# Patient Record
Sex: Male | Born: 1945 | Race: White | Hispanic: No | Marital: Married | Smoking: Never smoker
Health system: Southern US, Community
[De-identification: ages and names within clinical notes are randomized; demographics above are authoritative.]

## PROBLEM LIST (undated history)

## (undated) DIAGNOSIS — N486 Induration penis plastica: Secondary | ICD-10-CM

## (undated) DIAGNOSIS — E039 Hypothyroidism, unspecified: Secondary | ICD-10-CM

## (undated) HISTORY — PX: BIOPSY PROSTATE: PRO28

## (undated) HISTORY — PX: TONSILLECTOMY: SUR1361

## (undated) HISTORY — PX: EXCISIONAL HEMORRHOIDECTOMY: SHX1541

---

## 2004-12-01 HISTORY — PX: OTHER SURGICAL HISTORY: SHX169

## 2004-12-17 ENCOUNTER — Ambulatory Visit (HOSPITAL_BASED_OUTPATIENT_CLINIC_OR_DEPARTMENT_OTHER): Admission: RE | Admit: 2004-12-17 | Discharge: 2004-12-17 | Payer: Self-pay | Admitting: Urology

## 2008-05-09 ENCOUNTER — Encounter: Admission: RE | Admit: 2008-05-09 | Discharge: 2008-05-09 | Payer: Self-pay | Admitting: Otolaryngology

## 2008-05-23 ENCOUNTER — Encounter (INDEPENDENT_AMBULATORY_CARE_PROVIDER_SITE_OTHER): Payer: Self-pay | Admitting: Interventional Radiology

## 2008-05-23 ENCOUNTER — Other Ambulatory Visit: Admission: RE | Admit: 2008-05-23 | Discharge: 2008-05-23 | Payer: Self-pay | Admitting: Interventional Radiology

## 2008-05-23 ENCOUNTER — Encounter: Admission: RE | Admit: 2008-05-23 | Discharge: 2008-05-23 | Payer: Self-pay | Admitting: Otolaryngology

## 2008-12-01 HISTORY — PX: OTHER SURGICAL HISTORY: SHX169

## 2009-04-04 IMAGING — US US BIOPSY
1 series · 11 of 11 positions shown · non-contrast
Comparison: Comparison is made with ultrasound performed 05/09/2008
which revealed a dominant solid-appearing nodule in the right upper
pole of the thyroid. Request has been made for fine needle aspirate
of this nodule.
COMPARISON: Comparison is made with ultrasound performed
05/09/2008 which revealed a complex cyst in the lower pole on the
left.  Request has been made for fine needle aspirate.

CLINICAL DATA: Palpable bilateral thyroid nodules.  Request has
been made for fine needle aspirate.

ULTRASOUND-GUIDED NEEDLE ASPIRATE BIOPSY, RIGHT LOBE OF THYROID
The above procedure was discussed with the patient and written
informed consent was obtained.

[Series 1: us biopsy · 11 acquisitions, 11 frames shown]
[im 1/11]
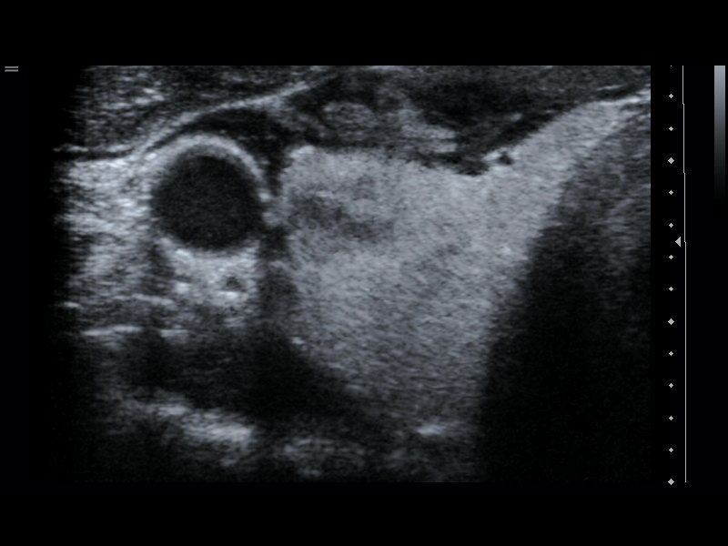
[im 2/11]
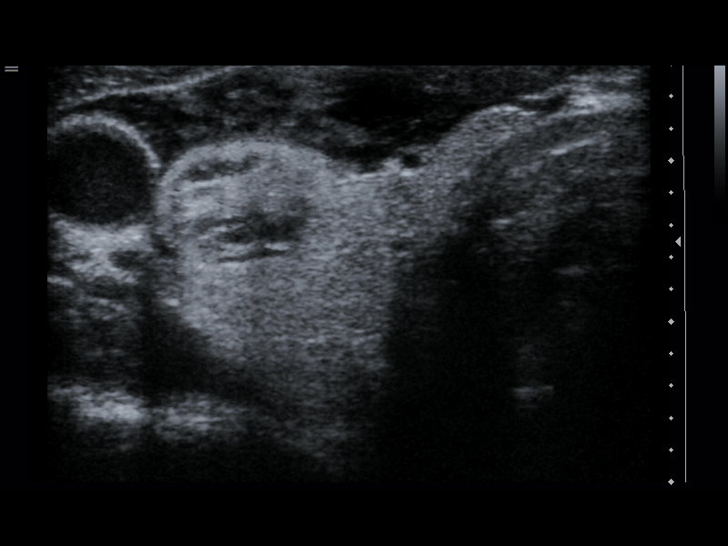
[im 3/11]
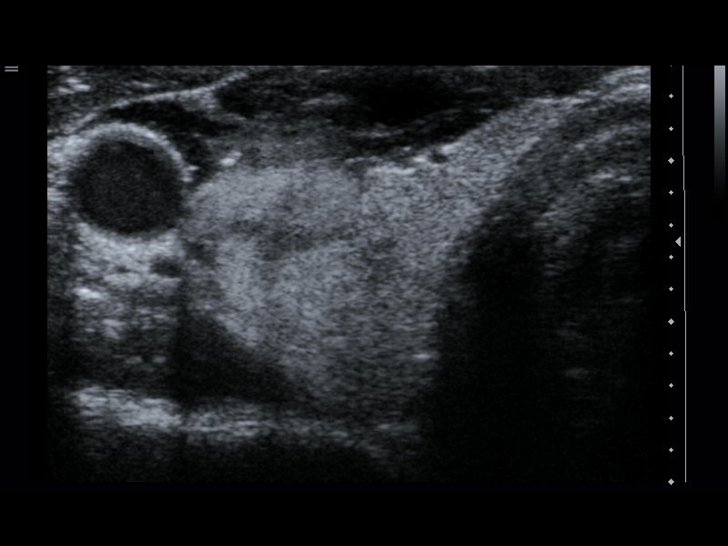
[im 4/11]
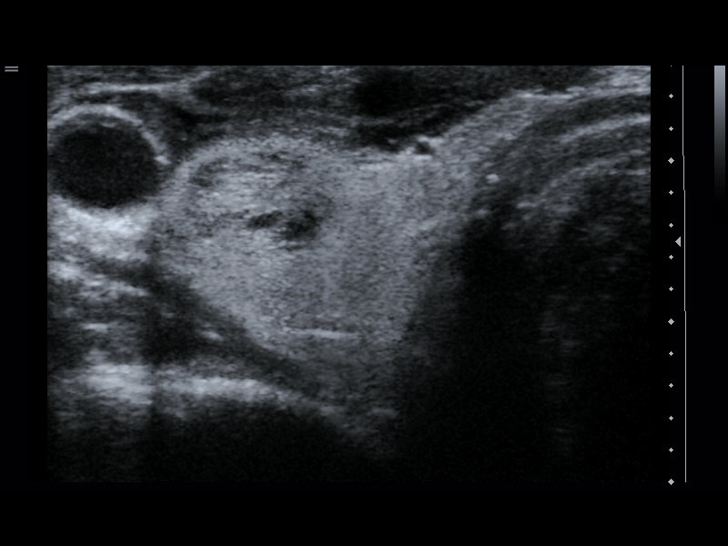
[im 5/11]
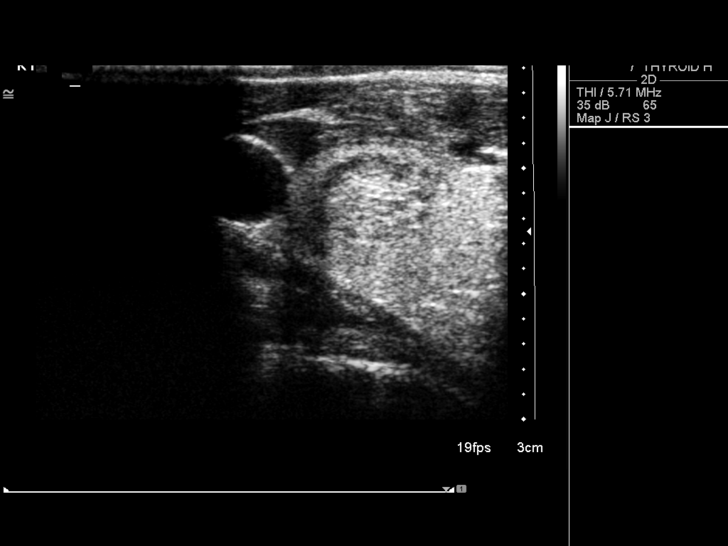
[im 6/11]
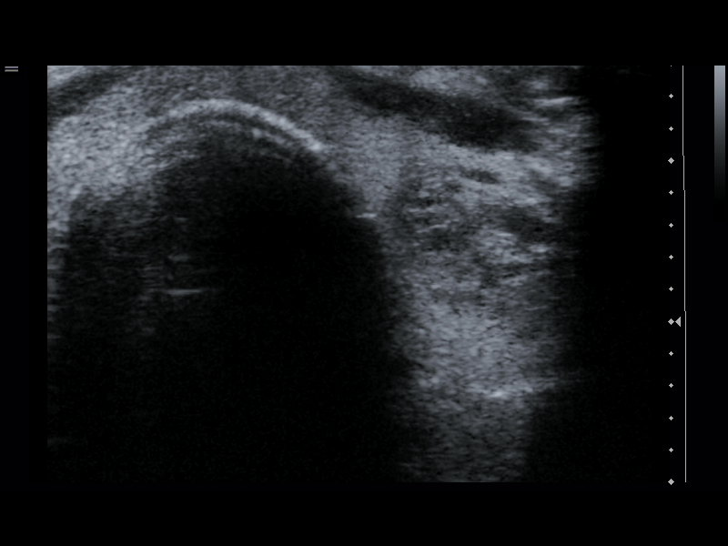
[im 7/11]
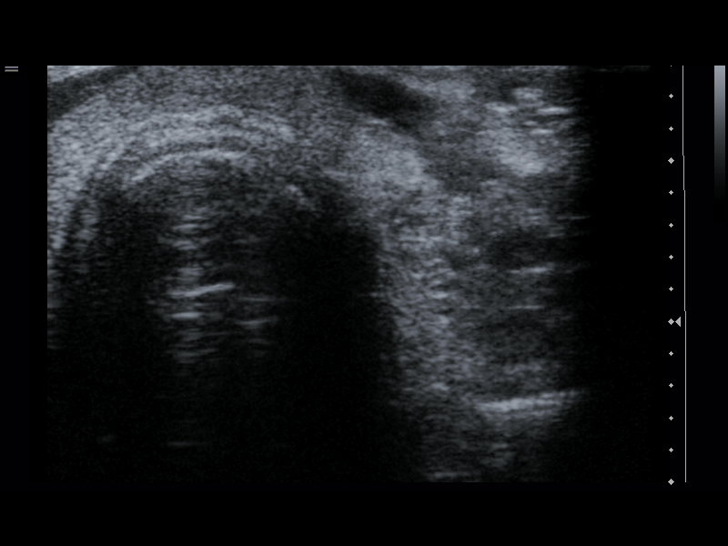
[im 8/11]
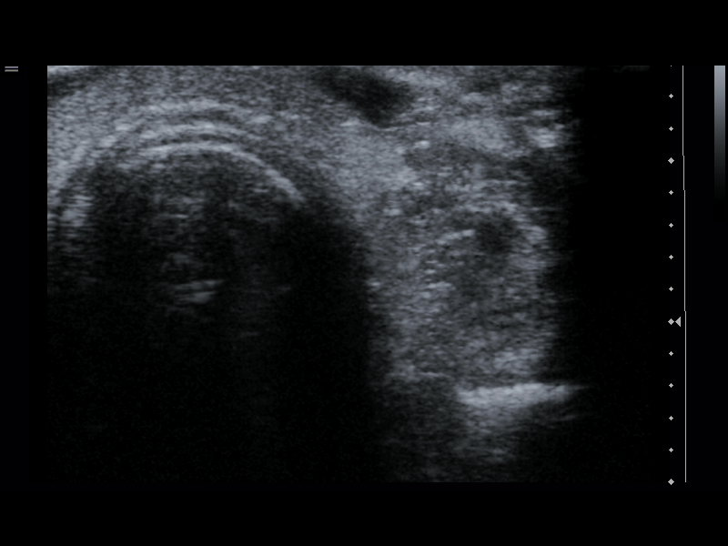
[im 9/11]
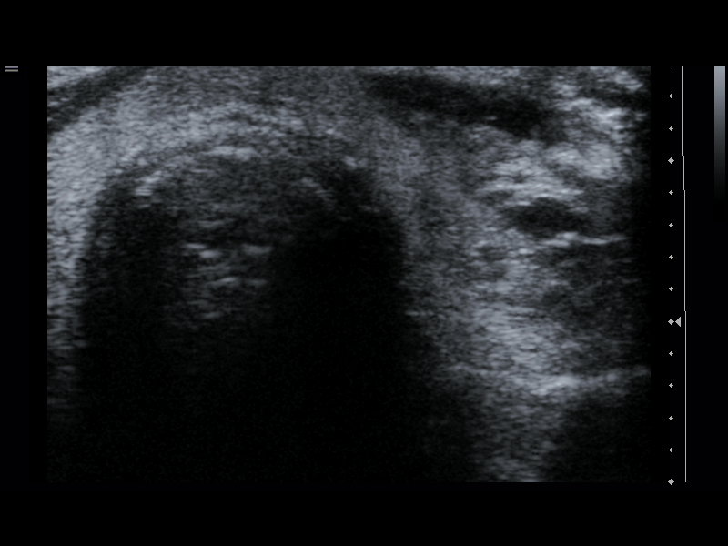
[im 10/11]
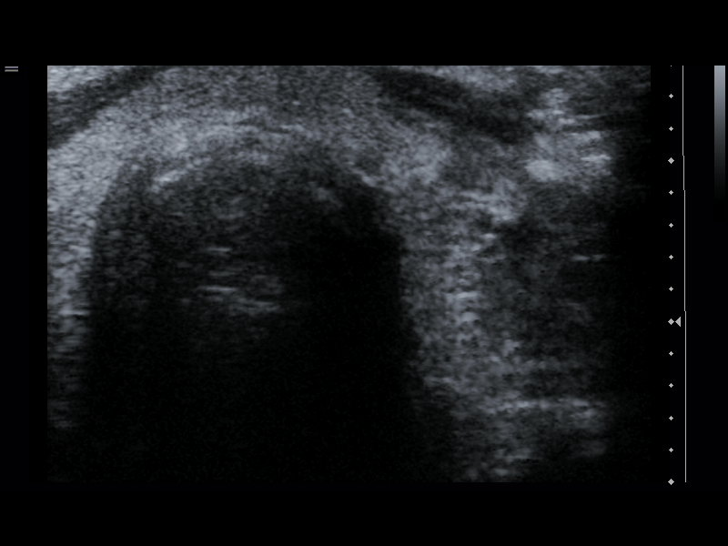
[im 11/11]
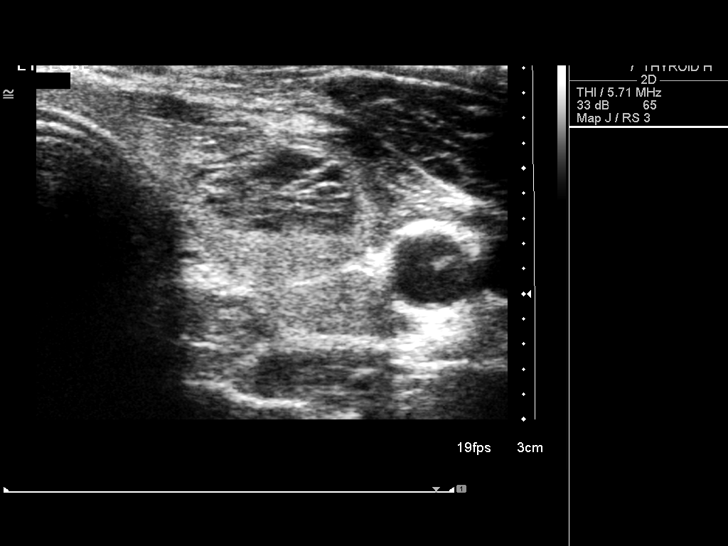

[11 of 11 positions shown; findings below may reference images not displayed]

FINDINGS: Ultrasound was performed to localize and mark an adequate
site for the biopsy.  The patient was then prepped and draped in a
normal sterile fashion.  Local anesthesia was provided with 1%
lidocaine.  Using direct ultrasound guidance, three passes were
made using 25 gauge needles into the nodule within the right lobe
of the thyroid.  Ultrasound was used to confirm needle placements
on all three occasions.  Specimens were sent to Pathology for
analysis.  Post procedural imaging demonstrated no hematoma or
immediate complication.  The patient tolerated the procedure well.
IMPRESSION: [Successful fine needle aspirate of dominant solid nodule of the
right upper pole of the thyroid as described above.

ULTRASOUND-GUIDED NEEDLE ASPIRATE BIOPSY, LEFT LOBE OF THYROID

The above procedure was discussed with the patient and written
informed consent was obtained.
FINDINGS: Ultrasound was performed to localize and mark an adequate
site for the biopsy.  The patient was then prepped and draped in a
normal sterile fashion.  Local anesthesia was provided with 1%
lidocaine.  Using direct ultrasound guidance, three passes were
made using 25 gauge needles into the nodule within the left lobe of
the thyroid.  Ultrasound was used to confirm needle placements on
all three occasions.  Specimens were sent to Pathology for
analysis.  Post procedural imaging demonstrated no hematoma or
immediate complication.  The patient tolerated the procedure well.
IMPRESSION: Successful fine needle aspirate of complex cyst of  the left lower
pole of the thyroid as described above.

Read by: Tollner, Labanc.-JULIO JUAN

## 2009-06-19 ENCOUNTER — Encounter: Admission: RE | Admit: 2009-06-19 | Discharge: 2009-06-19 | Payer: Self-pay | Admitting: Otolaryngology

## 2011-04-18 NOTE — Op Note (Signed)
NAME:  CAVON, NICOLLS NO.:  [id number]   MEDICAL RECORD NO.:  [id number]          PATIENT TYPE:  AMB   LOCATION:  [facility]                         FACILITY:  [facility]   PHYSICIAN:  Jamison Neighbor, M.D.  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   POSTOPERATIVE DIAGNOSIS:  Hydrocele of the cord.   PROCEDURE:  Right hydrocelectomy.   SURGEON:  Jamison Neighbor, M.D.   ANESTHESIA:  General.   COMPLICATIONS:  None.   DRAINS:  Two Penrose drains in the right hemi-scrotum.   INDICATIONS FOR PROCEDURE:  This 65 year old male has a fluid-filled mass in  the right testicle with possible inclusion of a spermatocele.  This appears  to be primarily in the cord, and not as involved with the testicle proper.  The testicle itself appears to be normal.  The patient would like to have  this excised.  He understands the risks and benefits of the procedure and  gave a full informed consent.   DESCRIPTION OF PROCEDURE:  After the successful induction of general  anesthesia, the patient was placed in the supine position and prepped with  Betadine and draped in the usual sterile fashion.  An incision was made in  the right hemi-scrotum, and the dissection was carried down until the  hydrocele sac was identified.  The sac was dissected away from the  surrounding tissue.  The gubernaculum was taken down, and the testicle  itself was elevated.  The hydrocele sac was then opened.  It appeared to  surround the testicle somewhat but primarily involved the cord itself.  This  entire area was drained.  The sac was turned inside out.  The edges were  oversewn with chromic.  Hemostasis was obtained with electrocautery.  The  epididymis was carefully checked, and it was free of any lesions.  There was  no true spermatocele.  The  testicle itself was normal in all ways.  The  testicle was returned back into the right hemi-scrotum.  The subcutaneous  tissue and dartos layer were pulled together with a running suture of #3-0  Vicryl.  The skin was closed with a running suture of #3-0 chromic.   The patient tolerated the procedure well.  He was sent home with a Penrose  drain coming out of a small puncture hole that was sutured in place with  chromic.  He was also given a scrotal support and fluffs.   DISPOSITION:  He will be sent home with doxycycline and pain medication.  He  was also given instructions about the use of ice and a scrotal support.      RJE/MEDQ  D:  12/17/2004  T:  12/17/2004  Job:  [id number]   cc:   Weyman Pedro, M.D.  [place], [place]

## 2015-07-30 ENCOUNTER — Other Ambulatory Visit: Payer: Self-pay | Admitting: Endocrinology

## 2015-07-30 DIAGNOSIS — E049 Nontoxic goiter, unspecified: Secondary | ICD-10-CM

## 2016-01-16 ENCOUNTER — Ambulatory Visit
Admission: RE | Admit: 2016-01-16 | Discharge: 2016-01-16 | Disposition: A | Discharge status: Home / Self Care | Payer: Medicare Other | Source: Ambulatory Visit | Attending: Endocrinology | Admitting: Endocrinology

## 2016-01-16 ENCOUNTER — Encounter (INDEPENDENT_AMBULATORY_CARE_PROVIDER_SITE_OTHER): Payer: Self-pay

## 2016-01-16 DIAGNOSIS — E049 Nontoxic goiter, unspecified: Secondary | ICD-10-CM

## 2016-01-16 DIAGNOSIS — E02 Subclinical iodine-deficiency hypothyroidism: Secondary | ICD-10-CM | POA: Diagnosis not present

## 2016-01-16 DIAGNOSIS — E041 Nontoxic single thyroid nodule: Secondary | ICD-10-CM | POA: Diagnosis not present

## 2016-01-25 DIAGNOSIS — E02 Subclinical iodine-deficiency hypothyroidism: Secondary | ICD-10-CM | POA: Diagnosis not present

## 2016-01-25 DIAGNOSIS — E049 Nontoxic goiter, unspecified: Secondary | ICD-10-CM | POA: Diagnosis not present

## 2016-01-28 ENCOUNTER — Other Ambulatory Visit: Payer: Self-pay | Admitting: Endocrinology

## 2016-01-28 DIAGNOSIS — E049 Nontoxic goiter, unspecified: Secondary | ICD-10-CM

## 2016-02-28 DIAGNOSIS — R972 Elevated prostate specific antigen [PSA]: Secondary | ICD-10-CM | POA: Diagnosis not present

## 2016-02-28 DIAGNOSIS — N138 Other obstructive and reflux uropathy: Secondary | ICD-10-CM | POA: Diagnosis not present

## 2016-02-28 DIAGNOSIS — N486 Induration penis plastica: Secondary | ICD-10-CM | POA: Diagnosis not present

## 2016-02-28 DIAGNOSIS — N400 Enlarged prostate without lower urinary tract symptoms: Secondary | ICD-10-CM | POA: Diagnosis not present

## 2016-03-11 DIAGNOSIS — E02 Subclinical iodine-deficiency hypothyroidism: Secondary | ICD-10-CM | POA: Diagnosis not present

## 2016-04-01 ENCOUNTER — Ambulatory Visit
Admission: RE | Admit: 2016-04-01 | Discharge: 2016-04-01 | Disposition: A | Discharge status: Home / Self Care | Payer: Medicare Other | Source: Ambulatory Visit | Attending: Endocrinology | Admitting: Endocrinology

## 2016-04-01 DIAGNOSIS — E049 Nontoxic goiter, unspecified: Secondary | ICD-10-CM

## 2016-04-01 DIAGNOSIS — E042 Nontoxic multinodular goiter: Secondary | ICD-10-CM | POA: Diagnosis not present

## 2016-04-17 DIAGNOSIS — R5383 Other fatigue: Secondary | ICD-10-CM | POA: Diagnosis not present

## 2016-04-17 DIAGNOSIS — Z299 Encounter for prophylactic measures, unspecified: Secondary | ICD-10-CM | POA: Diagnosis not present

## 2016-04-17 DIAGNOSIS — E7800 Pure hypercholesterolemia, unspecified: Secondary | ICD-10-CM | POA: Diagnosis not present

## 2016-04-17 DIAGNOSIS — Z7189 Other specified counseling: Secondary | ICD-10-CM | POA: Diagnosis not present

## 2016-04-17 DIAGNOSIS — Z125 Encounter for screening for malignant neoplasm of prostate: Secondary | ICD-10-CM | POA: Diagnosis not present

## 2016-04-17 DIAGNOSIS — Z1389 Encounter for screening for other disorder: Secondary | ICD-10-CM | POA: Diagnosis not present

## 2016-04-17 DIAGNOSIS — Z0000 Encounter for general adult medical examination without abnormal findings: Secondary | ICD-10-CM | POA: Diagnosis not present

## 2016-04-17 DIAGNOSIS — Z6823 Body mass index (BMI) 23.0-23.9, adult: Secondary | ICD-10-CM | POA: Diagnosis not present

## 2016-04-17 DIAGNOSIS — Z1211 Encounter for screening for malignant neoplasm of colon: Secondary | ICD-10-CM | POA: Diagnosis not present

## 2016-04-17 DIAGNOSIS — Z79899 Other long term (current) drug therapy: Secondary | ICD-10-CM | POA: Diagnosis not present

## 2016-04-18 DIAGNOSIS — E7800 Pure hypercholesterolemia, unspecified: Secondary | ICD-10-CM | POA: Diagnosis not present

## 2016-04-18 DIAGNOSIS — Z79899 Other long term (current) drug therapy: Secondary | ICD-10-CM | POA: Diagnosis not present

## 2016-04-18 DIAGNOSIS — R5383 Other fatigue: Secondary | ICD-10-CM | POA: Diagnosis not present

## 2016-04-18 DIAGNOSIS — Z125 Encounter for screening for malignant neoplasm of prostate: Secondary | ICD-10-CM | POA: Diagnosis not present

## 2016-04-24 DIAGNOSIS — E02 Subclinical iodine-deficiency hypothyroidism: Secondary | ICD-10-CM | POA: Diagnosis not present

## 2016-04-30 DIAGNOSIS — E02 Subclinical iodine-deficiency hypothyroidism: Secondary | ICD-10-CM | POA: Diagnosis not present

## 2016-04-30 DIAGNOSIS — E049 Nontoxic goiter, unspecified: Secondary | ICD-10-CM | POA: Diagnosis not present

## 2016-05-20 DIAGNOSIS — H2513 Age-related nuclear cataract, bilateral: Secondary | ICD-10-CM | POA: Diagnosis not present

## 2016-05-22 DIAGNOSIS — L821 Other seborrheic keratosis: Secondary | ICD-10-CM | POA: Diagnosis not present

## 2016-05-22 DIAGNOSIS — D239 Other benign neoplasm of skin, unspecified: Secondary | ICD-10-CM | POA: Diagnosis not present

## 2016-05-22 DIAGNOSIS — L570 Actinic keratosis: Secondary | ICD-10-CM | POA: Diagnosis not present

## 2016-07-07 DIAGNOSIS — H40013 Open angle with borderline findings, low risk, bilateral: Secondary | ICD-10-CM | POA: Diagnosis not present

## 2016-07-07 DIAGNOSIS — H2513 Age-related nuclear cataract, bilateral: Secondary | ICD-10-CM | POA: Diagnosis not present

## 2016-07-07 DIAGNOSIS — H5203 Hypermetropia, bilateral: Secondary | ICD-10-CM | POA: Diagnosis not present

## 2016-07-07 DIAGNOSIS — H524 Presbyopia: Secondary | ICD-10-CM | POA: Diagnosis not present

## 2016-07-11 DIAGNOSIS — H2511 Age-related nuclear cataract, right eye: Secondary | ICD-10-CM | POA: Diagnosis not present

## 2016-07-18 NOTE — Patient Instructions (Addendum)
Your procedure is scheduled on: 07/24/2016  Report to [facility] at  66  AM.  Call this number if you have problems the morning of surgery: [phone]   Do not eat food or drink liquids :After Midnight.      Take these medicines the morning of surgery with A SIP OF WATER: Synthroid   Do not wear jewelry, make-up or nail polish.  Do not wear lotions, powders, or perfumes. You may wear deodorant.  Do not shave 48 hours prior to surgery.  Do not bring valuables to the hospital.  Contacts, dentures or bridgework may not be worn into surgery.  Leave suitcase in the car. After surgery it may be brought to your room.  For patients admitted to the hospital, checkout time is 11:00 AM the day of discharge.   Patients discharged the day of surgery will not be allowed to drive home.  :     Please read over the following fact sheets that you were given: Coughing and Deep Breathing, Surgical Site Infection Prevention, Anesthesia Post-op Instructions and Care and Recovery After Surgery    Cataract A cataract is a clouding of the lens of the eye. When a lens becomes cloudy, vision is reduced based on the degree and nature of the clouding. Many cataracts reduce vision to some degree. Some cataracts make people more near-sighted as they develop. Other cataracts increase glare. Cataracts that are ignored and become worse can sometimes look white. The white color can be seen through the pupil. CAUSES   Aging. However, cataracts may occur at any age, even in newborns.   Certain drugs.   Trauma to the eye.   Certain diseases such as diabetes.   Specific eye diseases such as chronic inflammation inside the eye or a sudden attack of a rare form of glaucoma.   Inherited or acquired medical problems.  SYMPTOMS   Gradual, progressive drop in vision in the affected eye.   Severe, rapid visual loss. This most often happens when trauma is the cause.  DIAGNOSIS  To detect a cataract, an eye doctor  examines the lens. Cataracts are best diagnosed with an exam of the eyes with the pupils enlarged (dilated) by drops.  TREATMENT  For an early cataract, vision may improve by using different eyeglasses or stronger lighting. If that does not help your vision, surgery is the only effective treatment. A cataract needs to be surgically removed when vision loss interferes with your everyday activities, such as driving, reading, or watching TV. A cataract may also have to be removed if it prevents examination or treatment of another eye problem. Surgery removes the cloudy lens and usually replaces it with a substitute lens (intraocular lens, IOL).  At a time when both you and your doctor agree, the cataract will be surgically removed. If you have cataracts in both eyes, only one is usually removed at a time. This allows the operated eye to heal and be out of danger from any possible problems after surgery (such as infection or poor wound healing). In rare cases, a cataract may be doing damage to your eye. In these cases, your caregiver may advise surgical removal right away. The vast majority of people who have cataract surgery have better vision afterward. HOME CARE INSTRUCTIONS  If you are not planning surgery, you may be asked to do the following:  Use different eyeglasses.   Use stronger or brighter lighting.   Ask your eye doctor about reducing your medicine dose or  changing medicines if it is thought that a medicine caused your cataract. Changing medicines does not make the cataract go away on its own.   Become familiar with your surroundings. Poor vision can lead to injury. Avoid bumping into things on the affected side. You are at a higher risk for tripping or falling.   Exercise extreme care when driving or operating machinery.   Wear sunglasses if you are sensitive to bright light or experiencing problems with glare.  SEEK IMMEDIATE MEDICAL CARE IF:   You have a worsening or sudden vision  loss.   You notice redness, swelling, or increasing pain in the eye.   You have a fever.  Document Released: 11/17/2005 Document Revised: 11/06/2011 Document Reviewed: 07/11/2011 [facility] Patient Information 2012 [place].PATIENT INSTRUCTIONS POST-ANESTHESIA  IMMEDIATELY FOLLOWING SURGERY:  Do not drive or operate machinery for the first twenty four hours after surgery.  Do not make any important decisions for twenty four hours after surgery or while taking narcotic pain medications or sedatives.  If you develop intractable nausea and vomiting or a severe headache please notify your doctor immediately.  FOLLOW-UP:  Please make an appointment with your surgeon as instructed. You do not need to follow up with anesthesia unless specifically instructed to do so.  WOUND CARE INSTRUCTIONS (if applicable):  Keep a dry clean dressing on the anesthesia/puncture wound site if there is drainage.  Once the wound has quit draining you may leave it open to air.  Generally you should leave the bandage intact for twenty four hours unless there is drainage.  If the epidural site drains for more than 36-48 hours please call the anesthesia department.  QUESTIONS?:  Please feel free to call your physician or the hospital operator if you have any questions, and they will be happy to assist you.

## 2016-07-21 ENCOUNTER — Encounter (HOSPITAL_COMMUNITY): Payer: Self-pay

## 2016-07-21 ENCOUNTER — Other Ambulatory Visit: Payer: Self-pay

## 2016-07-21 ENCOUNTER — Encounter (HOSPITAL_COMMUNITY)
Admission: RE | Admit: 2016-07-21 | Discharge: 2016-07-21 | Disposition: A | Discharge status: Home / Self Care | Payer: Medicare Other | Source: Ambulatory Visit | Attending: Ophthalmology | Admitting: Ophthalmology

## 2016-07-21 DIAGNOSIS — E039 Hypothyroidism, unspecified: Secondary | ICD-10-CM | POA: Diagnosis not present

## 2016-07-21 DIAGNOSIS — H2511 Age-related nuclear cataract, right eye: Secondary | ICD-10-CM | POA: Diagnosis not present

## 2016-07-21 HISTORY — DX: Induration penis plastica: N48.6

## 2016-07-21 HISTORY — DX: Hypothyroidism, unspecified: E03.9

## 2016-07-21 LAB — CBC WITH DIFFERENTIAL/PLATELET
Basophils Absolute: 0.1 10*3/uL (ref 0.0–0.1)
Basophils Relative: 1 %
EOS PCT: 3 %
Eosinophils Absolute: 0.2 10*3/uL (ref 0.0–0.7)
HEMATOCRIT: 42.9 % (ref 39.0–52.0)
Hemoglobin: 14.3 g/dL (ref 13.0–17.0)
LYMPHS PCT: 24 %
Lymphs Abs: 1.4 10*3/uL (ref 0.7–4.0)
MCH: 29.5 pg (ref 26.0–34.0)
MCHC: 33.3 g/dL (ref 30.0–36.0)
MCV: 88.6 fL (ref 78.0–100.0)
MONO ABS: 0.5 10*3/uL (ref 0.1–1.0)
MONOS PCT: 8 %
NEUTROS ABS: 3.8 10*3/uL (ref 1.7–7.7)
Neutrophils Relative %: 64 %
PLATELETS: 167 10*3/uL (ref 150–400)
RBC: 4.84 MIL/uL (ref 4.22–5.81)
RDW: 13 % (ref 11.5–15.5)
WBC: 5.9 10*3/uL (ref 4.0–10.5)

## 2016-07-21 LAB — BASIC METABOLIC PANEL
Anion gap: 3 — ABNORMAL LOW (ref 5–15)
BUN: 13 mg/dL (ref 6–20)
CALCIUM: 9 mg/dL (ref 8.9–10.3)
CO2: 29 mmol/L (ref 22–32)
Chloride: 105 mmol/L (ref 101–111)
Creatinine, Ser: 0.91 mg/dL (ref 0.61–1.24)
GFR calc Af Amer: >60 mL/min (ref >60)
GFR calc non Af Amer: >60 mL/min (ref >60)
Glucose, Bld: 99 mg/dL (ref 65–99)
Potassium: 4.1 mmol/L (ref 3.5–5.1)
Sodium: 137 mmol/L (ref 135–145)

## 2016-07-23 ENCOUNTER — Encounter (HOSPITAL_COMMUNITY): Payer: Self-pay | Admitting: Anesthesiology

## 2016-07-23 NOTE — Anesthesia Preprocedure Evaluation (Addendum)
Anesthesia Evaluation  Patient identified by MRN, date of birth, ID band Patient awake    Reviewed: Allergy & Precautions, NPO status , Patient's Chart, lab work & pertinent test results  Airway Mallampati: I  TM Distance: >3 FB Neck ROM: Full    Dental  (+) Caps,    Pulmonary neg pulmonary ROS,    Pulmonary exam normal breath sounds clear to auscultation       Cardiovascular negative cardio ROS Normal cardiovascular exam Rhythm:Regular Rate:Normal     Neuro/Psych Cataract OD negative psych ROS   GI/Hepatic negative GI ROS, Neg liver ROS,   Endo/Other  Hypothyroidism   Renal/GU negative Renal ROS   Peyronie's Disease    Musculoskeletal negative musculoskeletal ROS (+)   Abdominal   Peds  Hematology negative hematology ROS (+)   Anesthesia Other Findings   Reproductive/Obstetrics                             Chemistry      Component Value Date/Time   NA 137 07/21/2016 1100   K 4.1 07/21/2016 1100   CL 105 07/21/2016 1100   CO2 29 07/21/2016 1100   BUN 13 07/21/2016 1100   CREATININE 0.91 07/21/2016 1100      Component Value Date/Time   CALCIUM 9.0 07/21/2016 1100     Lab Results  Component Value Date   WBC 5.9 07/21/2016   HGB 14.3 07/21/2016   HCT 42.9 07/21/2016   MCV 88.6 07/21/2016   PLT 167 07/21/2016   EKG: sinus bradycardia.  Anesthesia Physical Anesthesia Plan  ASA: II  Anesthesia Plan: MAC   Post-op Pain Management:    Induction:   Airway Management Planned: Natural Airway and Nasal Cannula  Additional Equipment:   Intra-op Plan:   Post-operative Plan:   Informed Consent: I have reviewed the patients History and Physical, chart, labs and discussed the procedure including the risks, benefits and alternatives for the proposed anesthesia with the patient or authorized representative who has indicated his/her understanding and acceptance.     Plan  Discussed with: CRNA, Anesthesiologist and Surgeon  Anesthesia Plan Comments:         Anesthesia Quick Evaluation

## 2016-07-24 ENCOUNTER — Encounter (HOSPITAL_COMMUNITY): Payer: Self-pay

## 2016-07-24 ENCOUNTER — Ambulatory Visit (HOSPITAL_COMMUNITY)
Admission: RE | Admit: 2016-07-24 | Discharge: 2016-07-24 | Disposition: A | Discharge status: Home / Self Care | Payer: Medicare Other | Source: Ambulatory Visit | Attending: Ophthalmology | Admitting: Ophthalmology

## 2016-07-24 ENCOUNTER — Ambulatory Visit (HOSPITAL_COMMUNITY): Payer: Medicare Other | Admitting: Anesthesiology

## 2016-07-24 ENCOUNTER — Encounter (HOSPITAL_COMMUNITY)
Admission: RE | Disposition: A | Discharge status: Home / Self Care | Payer: Self-pay | Source: Ambulatory Visit | Attending: Ophthalmology

## 2016-07-24 DIAGNOSIS — E039 Hypothyroidism, unspecified: Secondary | ICD-10-CM | POA: Diagnosis not present

## 2016-07-24 DIAGNOSIS — H2511 Age-related nuclear cataract, right eye: Secondary | ICD-10-CM | POA: Insufficient documentation

## 2016-07-24 HISTORY — PX: CATARACT EXTRACTION W/PHACO: SHX586

## 2016-07-24 SURGERY — PHACOEMULSIFICATION, CATARACT, WITH IOL INSERTION
Anesthesia: Monitor Anesthesia Care | Site: Eye | Laterality: Right

## 2016-07-24 MED ORDER — LACTATED RINGERS IV SOLN
INTRAVENOUS | Status: DC
  Administered 2016-07-24: 1000 mL via INTRAVENOUS

## 2016-07-24 MED ORDER — FENTANYL CITRATE (PF) 100 MCG/2ML IJ SOLN
25.0000 ug | INTRAMUSCULAR | Status: AC
  Administered 2016-07-24 (×2): 25 ug via INTRAVENOUS
  Filled 2016-07-24: qty 2

## 2016-07-24 MED ORDER — PROVISC 10 MG/ML IO SOLN
INTRAOCULAR | Status: DC | PRN
  Administered 2016-07-24: 0.85 mL via INTRAOCULAR

## 2016-07-24 MED ORDER — MIDAZOLAM HCL 2 MG/2ML IJ SOLN
INTRAMUSCULAR | Status: DC | PRN
  Administered 2016-07-24: 2 mg via INTRAVENOUS

## 2016-07-24 MED ORDER — LIDOCAINE 3.5 % OP GEL OPTIME - NO CHARGE
OPHTHALMIC | Status: DC | PRN
  Administered 2016-07-24: 1 [drp] via OPHTHALMIC

## 2016-07-24 MED ORDER — EPINEPHRINE HCL 1 MG/ML IJ SOLN
INTRAOCULAR | Status: DC | PRN
  Administered 2016-07-24: 500 mL

## 2016-07-24 MED ORDER — TETRACAINE HCL 0.5 % OP SOLN
1.0000 [drp] | OPHTHALMIC | Status: AC
  Administered 2016-07-24 (×3): 1 [drp] via OPHTHALMIC

## 2016-07-24 MED ORDER — MIDAZOLAM HCL 2 MG/2ML IJ SOLN
1.0000 mg | INTRAMUSCULAR | Status: DC | PRN
  Administered 2016-07-24: 2 mg via INTRAVENOUS
  Filled 2016-07-24: qty 2

## 2016-07-24 MED ORDER — BSS IO SOLN
INTRAOCULAR | Status: DC | PRN
  Administered 2016-07-24: 15 mL via INTRAOCULAR

## 2016-07-24 MED ORDER — ONDANSETRON HCL 4 MG/2ML IJ SOLN
INTRAMUSCULAR | Status: AC
  Filled 2016-07-24: qty 2

## 2016-07-24 MED ORDER — NEOMYCIN-POLYMYXIN-DEXAMETH 3.5-10000-0.1 OP SUSP
OPHTHALMIC | Status: DC | PRN
  Administered 2016-07-24: 2 [drp] via OPHTHALMIC

## 2016-07-24 MED ORDER — MIDAZOLAM HCL 2 MG/2ML IJ SOLN
INTRAMUSCULAR | Status: AC
  Filled 2016-07-24: qty 2

## 2016-07-24 MED ORDER — EPINEPHRINE HCL 1 MG/ML IJ SOLN
INTRAMUSCULAR | Status: AC
  Filled 2016-07-24: qty 1

## 2016-07-24 MED ORDER — ONDANSETRON HCL 4 MG/2ML IJ SOLN
INTRAMUSCULAR | Status: DC | PRN
  Administered 2016-07-24: 4 mg via INTRAVENOUS

## 2016-07-24 MED ORDER — PHENYLEPHRINE HCL 2.5 % OP SOLN
1.0000 [drp] | OPHTHALMIC | Status: AC
  Administered 2016-07-24 (×3): 1 [drp] via OPHTHALMIC

## 2016-07-24 MED ORDER — LIDOCAINE HCL (PF) 1 % IJ SOLN
INTRAMUSCULAR | Status: DC | PRN
  Administered 2016-07-24: .6 mL

## 2016-07-24 MED ORDER — CYCLOPENTOLATE-PHENYLEPHRINE 0.2-1 % OP SOLN
1.0000 [drp] | OPHTHALMIC | Status: AC
  Administered 2016-07-24 (×3): 1 [drp] via OPHTHALMIC

## 2016-07-24 MED ORDER — LIDOCAINE HCL 3.5 % OP GEL
1.0000 "application " | Freq: Once | OPHTHALMIC | Status: AC
  Administered 2016-07-24: 1 via OPHTHALMIC

## 2016-07-24 MED ORDER — POVIDONE-IODINE 5 % OP SOLN
OPHTHALMIC | Status: DC | PRN
  Administered 2016-07-24: 1 via OPHTHALMIC

## 2016-07-24 SURGICAL SUPPLY — 23 items
CAPSULAR TENSION RING-AMO (OPHTHALMIC RELATED) IMPLANT
CLOTH BEACON ORANGE TIMEOUT ST (SAFETY) ×3 IMPLANT
EYE SHIELD UNIVERSAL CLEAR (GAUZE/BANDAGES/DRESSINGS) ×3 IMPLANT
GLOVE BIOGEL PI IND STRL 7.0 (GLOVE) ×1 IMPLANT
GLOVE BIOGEL PI IND STRL 7.5 (GLOVE) IMPLANT
GLOVE BIOGEL PI INDICATOR 7.0 (GLOVE) ×2
GLOVE BIOGEL PI INDICATOR 7.5 (GLOVE)
GLOVE EXAM NITRILE LRG STRL (GLOVE) IMPLANT
GLOVE EXAM NITRILE MD LF STRL (GLOVE) ×3 IMPLANT
KIT VITRECTOMY (OPHTHALMIC RELATED) IMPLANT
LENS INTRAOCULAR RESTOR ×3 IMPLANT
PAD ARMBOARD 7.5X6 YLW CONV (MISCELLANEOUS) ×3 IMPLANT
PROC W NO LENS (INTRAOCULAR LENS)
PROC W SPEC LENS (INTRAOCULAR LENS) ×3
PROCESS W NO LENS (INTRAOCULAR LENS) IMPLANT
PROCESS W SPEC LENS (INTRAOCULAR LENS) ×1 IMPLANT
RETRACTOR IRIS SIGHTPATH (OPHTHALMIC RELATED) IMPLANT
RING MALYGIN (MISCELLANEOUS) IMPLANT
SYRINGE LUER LOK 1CC (MISCELLANEOUS) ×3 IMPLANT
TAPE SURG TRANSPORE 1 IN (GAUZE/BANDAGES/DRESSINGS) ×1 IMPLANT
TAPE SURGICAL TRANSPORE 1 IN (GAUZE/BANDAGES/DRESSINGS) ×2
VISCOELASTIC ADDITIONAL (OPHTHALMIC RELATED) IMPLANT
WATER STERILE IRR 250ML POUR (IV SOLUTION) ×3 IMPLANT

## 2016-07-24 NOTE — Anesthesia Postprocedure Evaluation (Signed)
Anesthesia Post Note  Patient: Anthony Cline  Procedure(s) Performed: Procedure(s) (LRB): CATARACT EXTRACTION PHACO AND INTRAOCULAR LENS PLACEMENT; CDE:  6.84 (Right)  Patient location during evaluation: PACU Anesthesia Type: MAC Level of consciousness: awake and alert and oriented Pain management: pain level controlled Vital Signs Assessment: post-procedure vital signs reviewed and stable Respiratory status: spontaneous breathing, nonlabored ventilation and respiratory function stable Cardiovascular status: stable and blood pressure returned to baseline Postop Assessment: no signs of nausea or vomiting Anesthetic complications: no    Last Vitals:  Vitals:   07/24/16 0940 07/24/16 1007  BP: 109/77 125/74  Pulse:  68  Resp: 20 18  Temp:  36.6 C    Last Pain:  Vitals:   07/24/16 1007  TempSrc: Oral                 Romney Compean A.

## 2016-07-24 NOTE — H&P (Signed)
I have reviewed the H&P, the patient was re-examined, and I have identified no interval changes in medical condition and plan of care since the history and physical of record  

## 2016-07-24 NOTE — Addendum Note (Signed)
Addendum  created 07/24/16 1016 by Josephine Igo, MD   Sign clinical note

## 2016-07-24 NOTE — Discharge Instructions (Signed)

## 2016-07-24 NOTE — Op Note (Signed)
Date of Admission: 07/24/2016  Date of Surgery: 07/24/2016   Pre-Op Dx: Cataract Right Eye  Post-Op Dx: Senile Nuclear Cataract Right  Eye,  Dx Code H25.11  Surgeon: Tonny Branch, M.D.  Assistants: None  Anesthesia: Topical with MAC  Indications: Painless, progressive loss of vision with compromise of daily activities.  Surgery: Cataract Extraction with Intraocular lens Implant Right Eye  Discription: The patient had dilating drops and viscous lidocaine placed into the Right eye in the pre-op holding area. After transfer to the operating room, a time out was performed. The patient was then prepped and draped. Beginning with a 55 degree blade a paracentesis port was made at the surgeon's 2 o'clock position. The anterior chamber was then filled with 1% non-preserved lidocaine. This was followed by filling the anterior chamber with Provisc.  A 2.23mm keratome blade was used to make a clear corneal incision at the temporal limbus.  A bent cystatome needle was used to create a continuous tear capsulotomy. Hydrodissection was performed with balanced salt solution on a Fine canula. The lens nucleus was then removed using the phacoemulsification handpiece. Residual cortex was removed with the I&A handpiece. The anterior chamber and capsular bag were refilled with Provisc. A posterior chamber intraocular lens was placed into the capsular bag with it's injector. The implant was positioned with the Kuglan hook. The Provisc was then removed from the anterior chamber and capsular bag with the I&A handpiece. Stromal hydration of the main incision and paracentesis port was performed with BSS on a Fine canula. The wounds were tested for leak which was negative. The patient tolerated the procedure well. There were no operative complications. The patient was then transferred to the recovery room in stable condition.  Complications: None  Specimen: None  EBL: None  Prosthetic device: Alcon [id number], power 19.5 D, SN  [id number].

## 2016-07-24 NOTE — Transfer of Care (Signed)
Immediate Anesthesia Transfer of Care Note  Patient: Anthony Cline  Procedure(s) Performed: Procedure(s) (LRB): CATARACT EXTRACTION PHACO AND INTRAOCULAR LENS PLACEMENT; CDE:  6.84 (Right)  Patient Location: Shortstay  Anesthesia Type: MAC  Level of Consciousness: awake  Airway & Oxygen Therapy: Patient Spontanous Breathing   Post-op Assessment: Report given to PACU RN, Post -op Vital signs reviewed and stable and Patient moving all extremities  Post vital signs: Reviewed and stable  Complications: No apparent anesthesia complications

## 2016-07-24 NOTE — Anesthesia Postprocedure Evaluation (Signed)
Anesthesia Post Note  Patient: Anthony Cline  Procedure(s) Performed: Procedure(s) (LRB): CATARACT EXTRACTION PHACO AND INTRAOCULAR LENS PLACEMENT; CDE:  6.84 (Right)  Anesthesia Post Evaluation  Last Vitals:  Vitals:   07/24/16 0935 07/24/16 0940  BP: 107/71 109/77  Pulse:    Resp: 19 20  Temp:      Last Pain:  Vitals:   07/24/16 0848  TempSrc: Oral                 Pablo Mathurin     Anesthesia Post-op Note  Patient: Anthony Cline  Procedure(s) Performed: Procedure(s) (LRB): CATARACT EXTRACTION PHACO AND INTRAOCULAR LENS PLACEMENT; CDE:  6.84 (Right)  Patient Location:  Short Stay  Anesthesia Type: MAC  Level of Consciousness: awake  Airway and Oxygen Therapy: Patient Spontanous Breathing  Post-op Pain: none  Post-op Assessment: Post-op Vital signs reviewed, Patient's Cardiovascular Status Stable, Respiratory Function Stable, Patent Airway, No signs of Nausea or vomiting and Pain level controlled  Post-op Vital Signs: Reviewed and stable  Complications: No apparent anesthesia complications

## 2016-07-30 ENCOUNTER — Encounter (HOSPITAL_COMMUNITY): Payer: Self-pay | Admitting: Ophthalmology

## 2016-07-31 DIAGNOSIS — E02 Subclinical iodine-deficiency hypothyroidism: Secondary | ICD-10-CM | POA: Diagnosis not present

## 2016-08-06 DIAGNOSIS — H5202 Hypermetropia, left eye: Secondary | ICD-10-CM | POA: Diagnosis not present

## 2016-08-06 DIAGNOSIS — H2512 Age-related nuclear cataract, left eye: Secondary | ICD-10-CM | POA: Diagnosis not present

## 2016-08-06 DIAGNOSIS — H5231 Anisometropia: Secondary | ICD-10-CM | POA: Diagnosis not present

## 2016-08-06 DIAGNOSIS — Z961 Presence of intraocular lens: Secondary | ICD-10-CM | POA: Diagnosis not present

## 2016-08-08 ENCOUNTER — Encounter (HOSPITAL_COMMUNITY)
Admission: RE | Admit: 2016-08-08 | Discharge: 2016-08-08 | Disposition: A | Discharge status: Home / Self Care | Payer: Medicare Other | Source: Ambulatory Visit | Attending: Ophthalmology | Admitting: Ophthalmology

## 2016-08-11 ENCOUNTER — Encounter (HOSPITAL_COMMUNITY)
Admission: RE | Disposition: A | Discharge status: Home / Self Care | Payer: Self-pay | Source: Ambulatory Visit | Attending: Ophthalmology

## 2016-08-11 ENCOUNTER — Encounter (HOSPITAL_COMMUNITY): Payer: Self-pay | Admitting: *Deleted

## 2016-08-11 ENCOUNTER — Ambulatory Visit (HOSPITAL_COMMUNITY): Payer: Medicare Other | Admitting: Anesthesiology

## 2016-08-11 ENCOUNTER — Ambulatory Visit (HOSPITAL_COMMUNITY)
Admission: RE | Admit: 2016-08-11 | Discharge: 2016-08-11 | Disposition: A | Discharge status: Home / Self Care | Payer: Medicare Other | Source: Ambulatory Visit | Attending: Ophthalmology | Admitting: Ophthalmology

## 2016-08-11 DIAGNOSIS — H2512 Age-related nuclear cataract, left eye: Secondary | ICD-10-CM | POA: Insufficient documentation

## 2016-08-11 DIAGNOSIS — Z791 Long term (current) use of non-steroidal anti-inflammatories (NSAID): Secondary | ICD-10-CM | POA: Diagnosis not present

## 2016-08-11 DIAGNOSIS — Z79899 Other long term (current) drug therapy: Secondary | ICD-10-CM | POA: Diagnosis not present

## 2016-08-11 DIAGNOSIS — Z7982 Long term (current) use of aspirin: Secondary | ICD-10-CM | POA: Diagnosis not present

## 2016-08-11 DIAGNOSIS — E039 Hypothyroidism, unspecified: Secondary | ICD-10-CM | POA: Diagnosis not present

## 2016-08-11 HISTORY — PX: CATARACT EXTRACTION W/PHACO: SHX586

## 2016-08-11 SURGERY — PHACOEMULSIFICATION, CATARACT, WITH IOL INSERTION
Anesthesia: Monitor Anesthesia Care | Site: Eye | Laterality: Left

## 2016-08-11 MED ORDER — LIDOCAINE HCL (PF) 1 % IJ SOLN
INTRAMUSCULAR | Status: DC | PRN
  Administered 2016-08-11: .6 mL

## 2016-08-11 MED ORDER — EPINEPHRINE HCL 1 MG/ML IJ SOLN
INTRAMUSCULAR | Status: AC
  Filled 2016-08-11: qty 1

## 2016-08-11 MED ORDER — EPINEPHRINE HCL 1 MG/ML IJ SOLN
INTRAMUSCULAR | Status: DC | PRN
  Administered 2016-08-11: 500 mL

## 2016-08-11 MED ORDER — CYCLOPENTOLATE-PHENYLEPHRINE 0.2-1 % OP SOLN
1.0000 [drp] | OPHTHALMIC | Status: AC
  Administered 2016-08-11 (×3): 1 [drp] via OPHTHALMIC

## 2016-08-11 MED ORDER — LACTATED RINGERS IV SOLN
INTRAVENOUS | Status: DC | PRN
  Administered 2016-08-11: 13:00:00 via INTRAVENOUS

## 2016-08-11 MED ORDER — TETRACAINE HCL 0.5 % OP SOLN
1.0000 [drp] | OPHTHALMIC | Status: AC
  Administered 2016-08-11 (×3): 1 [drp] via OPHTHALMIC

## 2016-08-11 MED ORDER — POVIDONE-IODINE 5 % OP SOLN
OPHTHALMIC | Status: DC | PRN
  Administered 2016-08-11: 1 via OPHTHALMIC

## 2016-08-11 MED ORDER — BSS IO SOLN
INTRAOCULAR | Status: DC | PRN
  Administered 2016-08-11: 15 mL

## 2016-08-11 MED ORDER — FENTANYL CITRATE (PF) 100 MCG/2ML IJ SOLN
25.0000 ug | Freq: Once | INTRAMUSCULAR | Status: AC
  Administered 2016-08-11: 25 ug via INTRAVENOUS
  Filled 2016-08-11 (×2): qty 2

## 2016-08-11 MED ORDER — LACTATED RINGERS IV SOLN
Freq: Once | INTRAVENOUS | Status: AC
  Administered 2016-08-11: 1000 mL via INTRAVENOUS

## 2016-08-11 MED ORDER — MIDAZOLAM HCL 2 MG/2ML IJ SOLN
2.0000 mg | Freq: Once | INTRAMUSCULAR | Status: AC
  Administered 2016-08-11: 2 mg via INTRAVENOUS
  Filled 2016-08-11 (×2): qty 2

## 2016-08-11 MED ORDER — PHENYLEPHRINE HCL 2.5 % OP SOLN
1.0000 [drp] | OPHTHALMIC | Status: AC
  Administered 2016-08-11 (×3): 1 [drp] via OPHTHALMIC

## 2016-08-11 MED ORDER — MIDAZOLAM HCL 2 MG/2ML IJ SOLN
INTRAMUSCULAR | Status: AC
  Filled 2016-08-11: qty 2

## 2016-08-11 MED ORDER — NEOMYCIN-POLYMYXIN-DEXAMETH 3.5-10000-0.1 OP SUSP
OPHTHALMIC | Status: DC | PRN
  Administered 2016-08-11: 2 [drp] via OPHTHALMIC

## 2016-08-11 MED ORDER — PROVISC 10 MG/ML IO SOLN
INTRAOCULAR | Status: DC | PRN
  Administered 2016-08-11: 0.85 mL via INTRAOCULAR

## 2016-08-11 MED ORDER — LIDOCAINE 3.5 % OP GEL OPTIME - NO CHARGE
OPHTHALMIC | Status: DC | PRN
  Administered 2016-08-11: 1 [drp] via OPHTHALMIC

## 2016-08-11 MED ORDER — FENTANYL CITRATE (PF) 100 MCG/2ML IJ SOLN
INTRAMUSCULAR | Status: AC
  Filled 2016-08-11: qty 2

## 2016-08-11 MED ORDER — LIDOCAINE HCL 3.5 % OP GEL
1.0000 | Freq: Once | OPHTHALMIC | Status: AC
Start: 2016-08-11 — End: 2016-08-11
  Administered 2016-08-11: 1 via OPHTHALMIC

## 2016-08-11 SURGICAL SUPPLY — 13 items
CLOTH BEACON ORANGE TIMEOUT ST (SAFETY) ×3 IMPLANT
EYE SHIELD UNIVERSAL CLEAR (GAUZE/BANDAGES/DRESSINGS) ×3 IMPLANT
GLOVE BIOGEL PI IND STRL 7.0 (GLOVE) ×1 IMPLANT
GLOVE BIOGEL PI INDICATOR 7.0 (GLOVE) ×2
GLOVE EXAM NITRILE MD LF STRL (GLOVE) ×3 IMPLANT
LENS INTRAOCULAR RESTOR ×3 IMPLANT
PAD ARMBOARD 7.5X6 YLW CONV (MISCELLANEOUS) ×3 IMPLANT
PROC W SPEC LENS (INTRAOCULAR LENS) ×3
PROCESS W SPEC LENS (INTRAOCULAR LENS) ×1 IMPLANT
SYRINGE LUER LOK 1CC (MISCELLANEOUS) ×3 IMPLANT
TAPE SURG TRANSPORE 1 IN (GAUZE/BANDAGES/DRESSINGS) ×1 IMPLANT
TAPE SURGICAL TRANSPORE 1 IN (GAUZE/BANDAGES/DRESSINGS) ×2
WATER STERILE IRR 250ML POUR (IV SOLUTION) ×3 IMPLANT

## 2016-08-11 NOTE — Discharge Instructions (Signed)

## 2016-08-11 NOTE — Transfer of Care (Signed)
Immediate Anesthesia Transfer of Care Note  Patient: Anthony Cline  Procedure(s) Performed: Procedure(s) with comments: CATARACT EXTRACTION PHACO AND INTRAOCULAR LENS PLACEMENT (IOC) (Left) - CDE: 5.01  Patient Location: Short Stay  Anesthesia Type:MAC  Level of Consciousness: awake, alert , oriented and patient cooperative  Airway & Oxygen Therapy: Patient Spontanous Breathing  Post-op Assessment: Report given to RN and Post -op Vital signs reviewed and stable  Post vital signs: Reviewed and stable  Last Vitals:  Vitals:   08/11/16 1405 08/11/16 1410  BP: 127/81 129/89  Resp: 16 (!) 22    Last Pain: There were no vitals filed for this visit.       Complications: No apparent anesthesia complications

## 2016-08-11 NOTE — Op Note (Signed)
Date of Admission: 08/11/2016  Date of Surgery: 08/11/2016   Pre-Op Dx: Cataract Left Eye  Post-Op Dx: Senile Nuclear Cataract Left  Eye,  Dx Code H25.12  Surgeon: Tonny Branch, M.D.  Assistants: None  Anesthesia: Topical with MAC  Indications: Painless, progressive loss of vision with compromise of daily activities.  Surgery: Cataract Extraction with Intraocular lens Implant Left Eye  Discription: The patient had dilating drops and viscous lidocaine placed into the Left eye in the pre-op holding area. After transfer to the operating room, a time out was performed. The patient was then prepped and draped. Beginning with a 65 degree blade a paracentesis port was made at the surgeon's 2 o'clock position. The anterior chamber was then filled with 1% non-preserved lidocaine. This was followed by filling the anterior chamber with Provisc.  A 2.82mm keratome blade was used to make a clear corneal incision at the temporal limbus.  A bent cystatome needle was used to create a continuous tear capsulotomy. Hydrodissection was performed with balanced salt solution on a Fine canula. The lens nucleus was then removed using the phacoemulsification handpiece. Residual cortex was removed with the I&A handpiece. The anterior chamber and capsular bag were refilled with Provisc. A posterior chamber intraocular lens was placed into the capsular bag with it's injector. The implant was positioned with the Kuglan hook. The Provisc was then removed from the anterior chamber and capsular bag with the I&A handpiece. Stromal hydration of the main incision and paracentesis port was performed with BSS on a Fine canula. The wounds were tested for leak which was negative. The patient tolerated the procedure well. There were no operative complications. The patient was then transferred to the recovery room in stable condition.  Complications: None  Specimen: None  EBL: None  Prosthetic device: Alcon ReStor [id number], power 19.0 D,  SN [id number].

## 2016-08-11 NOTE — H&P (Signed)
I have reviewed the H&P, the patient was re-examined, and I have identified no interval changes in medical condition and plan of care since the history and physical of record  

## 2016-08-11 NOTE — Anesthesia Postprocedure Evaluation (Signed)
Anesthesia Post Note  Patient: Anthony Cline  Procedure(s) Performed: Procedure(s) (LRB): CATARACT EXTRACTION PHACO AND INTRAOCULAR LENS PLACEMENT (IOC) (Left)  Patient location during evaluation: Short Stay Anesthesia Type: MAC Level of consciousness: awake and alert and oriented Pain management: pain level controlled Vital Signs Assessment: post-procedure vital signs reviewed and stable Respiratory status: spontaneous breathing Cardiovascular status: stable Postop Assessment: no signs of nausea or vomiting Anesthetic complications: no    Last Vitals:  Vitals:   08/11/16 1405 08/11/16 1410  BP: 127/81 129/89  Resp: 16 (!) 22    Last Pain: There were no vitals filed for this visit.               ADAMS, AMY A

## 2016-08-11 NOTE — Anesthesia Preprocedure Evaluation (Signed)
Anesthesia Evaluation  Patient identified by MRN, date of birth, ID band Patient awake    Reviewed: Allergy & Precautions, H&P , NPO status , Patient's Chart, lab work & pertinent test results  Airway Mallampati: II   Neck ROM: full    Dental   Pulmonary neg pulmonary ROS,    breath sounds clear to auscultation       Cardiovascular negative cardio ROS   Rhythm:regular Rate:Normal     Neuro/Psych    GI/Hepatic   Endo/Other  Hypothyroidism   Renal/GU      Musculoskeletal   Abdominal   Peds  Hematology   Anesthesia Other Findings   Reproductive/Obstetrics                             Anesthesia Physical Anesthesia Plan  ASA: II  Anesthesia Plan: MAC   Post-op Pain Management:    Induction: Intravenous  Airway Management Planned: Nasal Cannula  Additional Equipment:   Intra-op Plan:   Post-operative Plan:   Informed Consent: I have reviewed the patients History and Physical, chart, labs and discussed the procedure including the risks, benefits and alternatives for the proposed anesthesia with the patient or authorized representative who has indicated his/her understanding and acceptance.     Plan Discussed with: CRNA, Anesthesiologist and Surgeon  Anesthesia Plan Comments:         Anesthesia Quick Evaluation

## 2016-08-14 ENCOUNTER — Encounter (HOSPITAL_COMMUNITY): Payer: Self-pay | Admitting: Ophthalmology

## 2016-09-04 DIAGNOSIS — N401 Enlarged prostate with lower urinary tract symptoms: Secondary | ICD-10-CM | POA: Diagnosis not present

## 2016-09-04 DIAGNOSIS — R3915 Urgency of urination: Secondary | ICD-10-CM | POA: Diagnosis not present

## 2016-09-04 DIAGNOSIS — R350 Frequency of micturition: Secondary | ICD-10-CM | POA: Diagnosis not present

## 2016-09-04 DIAGNOSIS — R972 Elevated prostate specific antigen [PSA]: Secondary | ICD-10-CM | POA: Diagnosis not present

## 2016-09-29 DIAGNOSIS — Z23 Encounter for immunization: Secondary | ICD-10-CM | POA: Diagnosis not present

## 2016-12-29 DIAGNOSIS — Z299 Encounter for prophylactic measures, unspecified: Secondary | ICD-10-CM | POA: Diagnosis not present

## 2016-12-29 DIAGNOSIS — R509 Fever, unspecified: Secondary | ICD-10-CM | POA: Diagnosis not present

## 2016-12-29 DIAGNOSIS — Z789 Other specified health status: Secondary | ICD-10-CM | POA: Diagnosis not present

## 2016-12-29 DIAGNOSIS — J111 Influenza due to unidentified influenza virus with other respiratory manifestations: Secondary | ICD-10-CM | POA: Diagnosis not present

## 2016-12-31 ENCOUNTER — Other Ambulatory Visit: Payer: Self-pay | Admitting: Endocrinology

## 2016-12-31 DIAGNOSIS — E02 Subclinical iodine-deficiency hypothyroidism: Secondary | ICD-10-CM

## 2017-02-11 ENCOUNTER — Other Ambulatory Visit: Payer: Self-pay | Admitting: Physician Assistant

## 2017-02-11 DIAGNOSIS — C4492 Squamous cell carcinoma of skin, unspecified: Secondary | ICD-10-CM

## 2017-02-11 DIAGNOSIS — L570 Actinic keratosis: Secondary | ICD-10-CM | POA: Diagnosis not present

## 2017-02-11 DIAGNOSIS — D492 Neoplasm of unspecified behavior of bone, soft tissue, and skin: Secondary | ICD-10-CM | POA: Diagnosis not present

## 2017-02-11 DIAGNOSIS — D0439 Carcinoma in situ of skin of other parts of face: Secondary | ICD-10-CM | POA: Diagnosis not present

## 2017-02-11 HISTORY — DX: Squamous cell carcinoma of skin, unspecified: C44.92

## 2017-03-05 DIAGNOSIS — N529 Male erectile dysfunction, unspecified: Secondary | ICD-10-CM | POA: Diagnosis not present

## 2017-03-05 DIAGNOSIS — R972 Elevated prostate specific antigen [PSA]: Secondary | ICD-10-CM | POA: Diagnosis not present

## 2017-03-05 DIAGNOSIS — N401 Enlarged prostate with lower urinary tract symptoms: Secondary | ICD-10-CM | POA: Diagnosis not present

## 2017-03-05 DIAGNOSIS — N486 Induration penis plastica: Secondary | ICD-10-CM | POA: Diagnosis not present

## 2017-03-10 DIAGNOSIS — Z961 Presence of intraocular lens: Secondary | ICD-10-CM | POA: Diagnosis not present

## 2017-03-26 DIAGNOSIS — D0439 Carcinoma in situ of skin of other parts of face: Secondary | ICD-10-CM | POA: Diagnosis not present

## 2017-03-26 DIAGNOSIS — L570 Actinic keratosis: Secondary | ICD-10-CM | POA: Diagnosis not present

## 2017-04-23 ENCOUNTER — Ambulatory Visit
Admission: RE | Admit: 2017-04-23 | Discharge: 2017-04-23 | Disposition: A | Discharge status: Home / Self Care | Payer: Medicare Other | Source: Ambulatory Visit | Attending: Endocrinology | Admitting: Endocrinology

## 2017-04-23 DIAGNOSIS — E02 Subclinical iodine-deficiency hypothyroidism: Secondary | ICD-10-CM

## 2017-04-23 DIAGNOSIS — E041 Nontoxic single thyroid nodule: Secondary | ICD-10-CM | POA: Diagnosis not present

## 2017-04-28 DIAGNOSIS — Z299 Encounter for prophylactic measures, unspecified: Secondary | ICD-10-CM | POA: Diagnosis not present

## 2017-04-28 DIAGNOSIS — Z79899 Other long term (current) drug therapy: Secondary | ICD-10-CM | POA: Diagnosis not present

## 2017-04-28 DIAGNOSIS — Z7189 Other specified counseling: Secondary | ICD-10-CM | POA: Diagnosis not present

## 2017-04-28 DIAGNOSIS — I6529 Occlusion and stenosis of unspecified carotid artery: Secondary | ICD-10-CM | POA: Diagnosis not present

## 2017-04-28 DIAGNOSIS — Z1389 Encounter for screening for other disorder: Secondary | ICD-10-CM | POA: Diagnosis not present

## 2017-04-28 DIAGNOSIS — Z0000 Encounter for general adult medical examination without abnormal findings: Secondary | ICD-10-CM | POA: Diagnosis not present

## 2017-04-28 DIAGNOSIS — N400 Enlarged prostate without lower urinary tract symptoms: Secondary | ICD-10-CM | POA: Diagnosis not present

## 2017-04-28 DIAGNOSIS — Z1211 Encounter for screening for malignant neoplasm of colon: Secondary | ICD-10-CM | POA: Diagnosis not present

## 2017-04-28 DIAGNOSIS — E7800 Pure hypercholesterolemia, unspecified: Secondary | ICD-10-CM | POA: Diagnosis not present

## 2017-04-30 DIAGNOSIS — E02 Subclinical iodine-deficiency hypothyroidism: Secondary | ICD-10-CM | POA: Diagnosis not present

## 2017-04-30 DIAGNOSIS — E049 Nontoxic goiter, unspecified: Secondary | ICD-10-CM | POA: Diagnosis not present

## 2017-06-23 DIAGNOSIS — D229 Melanocytic nevi, unspecified: Secondary | ICD-10-CM | POA: Diagnosis not present

## 2017-06-23 DIAGNOSIS — L570 Actinic keratosis: Secondary | ICD-10-CM | POA: Diagnosis not present

## 2017-07-02 DIAGNOSIS — H40002 Preglaucoma, unspecified, left eye: Secondary | ICD-10-CM | POA: Diagnosis not present

## 2017-07-02 DIAGNOSIS — Z961 Presence of intraocular lens: Secondary | ICD-10-CM | POA: Diagnosis not present

## 2017-07-02 DIAGNOSIS — H26493 Other secondary cataract, bilateral: Secondary | ICD-10-CM | POA: Diagnosis not present

## 2017-07-02 DIAGNOSIS — H40013 Open angle with borderline findings, low risk, bilateral: Secondary | ICD-10-CM | POA: Diagnosis not present

## 2017-07-02 DIAGNOSIS — Z83511 Family history of glaucoma: Secondary | ICD-10-CM | POA: Diagnosis not present

## 2017-07-02 DIAGNOSIS — H43812 Vitreous degeneration, left eye: Secondary | ICD-10-CM | POA: Diagnosis not present

## 2017-07-02 DIAGNOSIS — H40001 Preglaucoma, unspecified, right eye: Secondary | ICD-10-CM | POA: Diagnosis not present

## 2017-07-24 DIAGNOSIS — Z83511 Family history of glaucoma: Secondary | ICD-10-CM | POA: Diagnosis not present

## 2017-07-24 DIAGNOSIS — H43811 Vitreous degeneration, right eye: Secondary | ICD-10-CM | POA: Diagnosis not present

## 2017-07-24 DIAGNOSIS — H43812 Vitreous degeneration, left eye: Secondary | ICD-10-CM | POA: Diagnosis not present

## 2017-07-24 DIAGNOSIS — Z961 Presence of intraocular lens: Secondary | ICD-10-CM | POA: Diagnosis not present

## 2017-07-24 DIAGNOSIS — H40013 Open angle with borderline findings, low risk, bilateral: Secondary | ICD-10-CM | POA: Diagnosis not present

## 2017-07-24 DIAGNOSIS — H26493 Other secondary cataract, bilateral: Secondary | ICD-10-CM | POA: Diagnosis not present

## 2017-08-12 DIAGNOSIS — H43813 Vitreous degeneration, bilateral: Secondary | ICD-10-CM | POA: Diagnosis not present

## 2017-08-12 DIAGNOSIS — Z9849 Cataract extraction status, unspecified eye: Secondary | ICD-10-CM | POA: Diagnosis not present

## 2017-08-12 DIAGNOSIS — H26493 Other secondary cataract, bilateral: Secondary | ICD-10-CM | POA: Diagnosis not present

## 2017-08-12 DIAGNOSIS — Z961 Presence of intraocular lens: Secondary | ICD-10-CM | POA: Diagnosis not present

## 2017-08-12 DIAGNOSIS — Z83511 Family history of glaucoma: Secondary | ICD-10-CM | POA: Diagnosis not present

## 2017-08-12 DIAGNOSIS — H40013 Open angle with borderline findings, low risk, bilateral: Secondary | ICD-10-CM | POA: Diagnosis not present

## 2017-09-08 DIAGNOSIS — R972 Elevated prostate specific antigen [PSA]: Secondary | ICD-10-CM | POA: Diagnosis not present

## 2017-09-08 DIAGNOSIS — N400 Enlarged prostate without lower urinary tract symptoms: Secondary | ICD-10-CM | POA: Diagnosis not present

## 2017-09-15 DIAGNOSIS — Z23 Encounter for immunization: Secondary | ICD-10-CM | POA: Diagnosis not present

## 2017-10-05 DIAGNOSIS — R972 Elevated prostate specific antigen [PSA]: Secondary | ICD-10-CM | POA: Diagnosis not present

## 2017-12-22 DIAGNOSIS — Z9849 Cataract extraction status, unspecified eye: Secondary | ICD-10-CM | POA: Diagnosis not present

## 2017-12-22 DIAGNOSIS — H40013 Open angle with borderline findings, low risk, bilateral: Secondary | ICD-10-CM | POA: Diagnosis not present

## 2017-12-22 DIAGNOSIS — H04123 Dry eye syndrome of bilateral lacrimal glands: Secondary | ICD-10-CM | POA: Diagnosis not present

## 2017-12-22 DIAGNOSIS — Z961 Presence of intraocular lens: Secondary | ICD-10-CM | POA: Diagnosis not present

## 2017-12-22 DIAGNOSIS — Z83511 Family history of glaucoma: Secondary | ICD-10-CM | POA: Diagnosis not present

## 2017-12-22 DIAGNOSIS — H16223 Keratoconjunctivitis sicca, not specified as Sjogren's, bilateral: Secondary | ICD-10-CM | POA: Diagnosis not present

## 2017-12-29 ENCOUNTER — Other Ambulatory Visit: Payer: Self-pay | Admitting: Physician Assistant

## 2017-12-29 DIAGNOSIS — D485 Neoplasm of uncertain behavior of skin: Secondary | ICD-10-CM | POA: Diagnosis not present

## 2017-12-29 DIAGNOSIS — L570 Actinic keratosis: Secondary | ICD-10-CM | POA: Diagnosis not present

## 2017-12-29 DIAGNOSIS — Z85828 Personal history of other malignant neoplasm of skin: Secondary | ICD-10-CM | POA: Diagnosis not present

## 2017-12-29 DIAGNOSIS — D229 Melanocytic nevi, unspecified: Secondary | ICD-10-CM | POA: Diagnosis not present

## 2018-01-26 DIAGNOSIS — M5412 Radiculopathy, cervical region: Secondary | ICD-10-CM | POA: Diagnosis not present

## 2018-01-26 DIAGNOSIS — Z299 Encounter for prophylactic measures, unspecified: Secondary | ICD-10-CM | POA: Diagnosis not present

## 2018-01-26 DIAGNOSIS — Z713 Dietary counseling and surveillance: Secondary | ICD-10-CM | POA: Diagnosis not present

## 2018-01-26 DIAGNOSIS — M542 Cervicalgia: Secondary | ICD-10-CM | POA: Diagnosis not present

## 2018-01-26 DIAGNOSIS — Z6824 Body mass index (BMI) 24.0-24.9, adult: Secondary | ICD-10-CM | POA: Diagnosis not present

## 2018-02-18 DIAGNOSIS — I6529 Occlusion and stenosis of unspecified carotid artery: Secondary | ICD-10-CM | POA: Diagnosis not present

## 2018-02-18 DIAGNOSIS — Z6824 Body mass index (BMI) 24.0-24.9, adult: Secondary | ICD-10-CM | POA: Diagnosis not present

## 2018-02-18 DIAGNOSIS — E7800 Pure hypercholesterolemia, unspecified: Secondary | ICD-10-CM | POA: Diagnosis not present

## 2018-02-18 DIAGNOSIS — M542 Cervicalgia: Secondary | ICD-10-CM | POA: Diagnosis not present

## 2018-02-18 DIAGNOSIS — M5412 Radiculopathy, cervical region: Secondary | ICD-10-CM | POA: Diagnosis not present

## 2018-02-18 DIAGNOSIS — Z299 Encounter for prophylactic measures, unspecified: Secondary | ICD-10-CM | POA: Diagnosis not present

## 2018-02-24 DIAGNOSIS — M542 Cervicalgia: Secondary | ICD-10-CM | POA: Diagnosis not present

## 2018-02-24 DIAGNOSIS — M47812 Spondylosis without myelopathy or radiculopathy, cervical region: Secondary | ICD-10-CM | POA: Diagnosis not present

## 2018-02-24 DIAGNOSIS — M4302 Spondylolysis, cervical region: Secondary | ICD-10-CM | POA: Diagnosis not present

## 2018-02-24 DIAGNOSIS — M4802 Spinal stenosis, cervical region: Secondary | ICD-10-CM | POA: Diagnosis not present

## 2018-02-24 DIAGNOSIS — M5412 Radiculopathy, cervical region: Secondary | ICD-10-CM | POA: Diagnosis not present

## 2018-03-09 DIAGNOSIS — R39198 Other difficulties with micturition: Secondary | ICD-10-CM | POA: Diagnosis not present

## 2018-03-09 DIAGNOSIS — R972 Elevated prostate specific antigen [PSA]: Secondary | ICD-10-CM | POA: Diagnosis not present

## 2018-03-09 DIAGNOSIS — N401 Enlarged prostate with lower urinary tract symptoms: Secondary | ICD-10-CM | POA: Diagnosis not present

## 2018-03-09 DIAGNOSIS — R350 Frequency of micturition: Secondary | ICD-10-CM | POA: Diagnosis not present

## 2018-03-09 DIAGNOSIS — R3915 Urgency of urination: Secondary | ICD-10-CM | POA: Diagnosis not present

## 2018-03-11 DIAGNOSIS — Z6825 Body mass index (BMI) 25.0-25.9, adult: Secondary | ICD-10-CM | POA: Diagnosis not present

## 2018-03-11 DIAGNOSIS — M542 Cervicalgia: Secondary | ICD-10-CM | POA: Diagnosis not present

## 2018-03-11 DIAGNOSIS — Z299 Encounter for prophylactic measures, unspecified: Secondary | ICD-10-CM | POA: Diagnosis not present

## 2018-03-11 DIAGNOSIS — Z713 Dietary counseling and surveillance: Secondary | ICD-10-CM | POA: Diagnosis not present

## 2018-03-11 DIAGNOSIS — M9981 Other biomechanical lesions of cervical region: Secondary | ICD-10-CM | POA: Diagnosis not present

## 2018-04-20 DIAGNOSIS — E02 Subclinical iodine-deficiency hypothyroidism: Secondary | ICD-10-CM | POA: Diagnosis not present

## 2018-04-27 DIAGNOSIS — E049 Nontoxic goiter, unspecified: Secondary | ICD-10-CM | POA: Diagnosis not present

## 2018-04-27 DIAGNOSIS — E02 Subclinical iodine-deficiency hypothyroidism: Secondary | ICD-10-CM | POA: Diagnosis not present

## 2018-04-28 DIAGNOSIS — R030 Elevated blood-pressure reading, without diagnosis of hypertension: Secondary | ICD-10-CM | POA: Diagnosis not present

## 2018-04-28 DIAGNOSIS — M47812 Spondylosis without myelopathy or radiculopathy, cervical region: Secondary | ICD-10-CM | POA: Diagnosis not present

## 2018-04-28 DIAGNOSIS — Z6825 Body mass index (BMI) 25.0-25.9, adult: Secondary | ICD-10-CM | POA: Diagnosis not present

## 2018-04-28 DIAGNOSIS — M5412 Radiculopathy, cervical region: Secondary | ICD-10-CM | POA: Diagnosis not present

## 2018-04-28 DIAGNOSIS — M542 Cervicalgia: Secondary | ICD-10-CM | POA: Diagnosis not present

## 2018-04-28 DIAGNOSIS — G5603 Carpal tunnel syndrome, bilateral upper limbs: Secondary | ICD-10-CM | POA: Diagnosis not present

## 2018-05-03 DIAGNOSIS — M4802 Spinal stenosis, cervical region: Secondary | ICD-10-CM | POA: Diagnosis not present

## 2018-05-04 DIAGNOSIS — Z7189 Other specified counseling: Secondary | ICD-10-CM | POA: Diagnosis not present

## 2018-05-04 DIAGNOSIS — Z79899 Other long term (current) drug therapy: Secondary | ICD-10-CM | POA: Diagnosis not present

## 2018-05-04 DIAGNOSIS — Z125 Encounter for screening for malignant neoplasm of prostate: Secondary | ICD-10-CM | POA: Diagnosis not present

## 2018-05-04 DIAGNOSIS — Z789 Other specified health status: Secondary | ICD-10-CM | POA: Diagnosis not present

## 2018-05-04 DIAGNOSIS — R5383 Other fatigue: Secondary | ICD-10-CM | POA: Diagnosis not present

## 2018-05-04 DIAGNOSIS — Z299 Encounter for prophylactic measures, unspecified: Secondary | ICD-10-CM | POA: Diagnosis not present

## 2018-05-04 DIAGNOSIS — Z6825 Body mass index (BMI) 25.0-25.9, adult: Secondary | ICD-10-CM | POA: Diagnosis not present

## 2018-05-04 DIAGNOSIS — E7800 Pure hypercholesterolemia, unspecified: Secondary | ICD-10-CM | POA: Diagnosis not present

## 2018-05-04 DIAGNOSIS — Z1339 Encounter for screening examination for other mental health and behavioral disorders: Secondary | ICD-10-CM | POA: Diagnosis not present

## 2018-05-04 DIAGNOSIS — Z0000 Encounter for general adult medical examination without abnormal findings: Secondary | ICD-10-CM | POA: Diagnosis not present

## 2018-05-04 DIAGNOSIS — I6529 Occlusion and stenosis of unspecified carotid artery: Secondary | ICD-10-CM | POA: Diagnosis not present

## 2018-05-04 DIAGNOSIS — Z1331 Encounter for screening for depression: Secondary | ICD-10-CM | POA: Diagnosis not present

## 2018-05-04 DIAGNOSIS — Z1211 Encounter for screening for malignant neoplasm of colon: Secondary | ICD-10-CM | POA: Diagnosis not present

## 2018-05-04 DIAGNOSIS — N400 Enlarged prostate without lower urinary tract symptoms: Secondary | ICD-10-CM | POA: Diagnosis not present

## 2018-05-05 DIAGNOSIS — M4802 Spinal stenosis, cervical region: Secondary | ICD-10-CM | POA: Diagnosis not present

## 2018-05-10 DIAGNOSIS — M4802 Spinal stenosis, cervical region: Secondary | ICD-10-CM | POA: Diagnosis not present

## 2018-05-13 DIAGNOSIS — M4802 Spinal stenosis, cervical region: Secondary | ICD-10-CM | POA: Diagnosis not present

## 2018-05-17 DIAGNOSIS — M4802 Spinal stenosis, cervical region: Secondary | ICD-10-CM | POA: Diagnosis not present

## 2018-05-20 DIAGNOSIS — M4802 Spinal stenosis, cervical region: Secondary | ICD-10-CM | POA: Diagnosis not present

## 2018-05-24 DIAGNOSIS — M4802 Spinal stenosis, cervical region: Secondary | ICD-10-CM | POA: Diagnosis not present

## 2018-05-24 DIAGNOSIS — R200 Anesthesia of skin: Secondary | ICD-10-CM | POA: Diagnosis not present

## 2018-05-24 DIAGNOSIS — Z6825 Body mass index (BMI) 25.0-25.9, adult: Secondary | ICD-10-CM | POA: Diagnosis not present

## 2018-05-24 DIAGNOSIS — M9981 Other biomechanical lesions of cervical region: Secondary | ICD-10-CM | POA: Diagnosis not present

## 2018-05-24 DIAGNOSIS — I10 Essential (primary) hypertension: Secondary | ICD-10-CM | POA: Diagnosis not present

## 2018-05-24 DIAGNOSIS — M5412 Radiculopathy, cervical region: Secondary | ICD-10-CM | POA: Diagnosis not present

## 2018-05-27 DIAGNOSIS — M4802 Spinal stenosis, cervical region: Secondary | ICD-10-CM | POA: Diagnosis not present

## 2018-05-31 DIAGNOSIS — M4802 Spinal stenosis, cervical region: Secondary | ICD-10-CM | POA: Diagnosis not present

## 2018-06-01 DIAGNOSIS — M4802 Spinal stenosis, cervical region: Secondary | ICD-10-CM | POA: Diagnosis not present

## 2018-06-14 DIAGNOSIS — M4802 Spinal stenosis, cervical region: Secondary | ICD-10-CM | POA: Diagnosis not present

## 2018-06-17 DIAGNOSIS — M4802 Spinal stenosis, cervical region: Secondary | ICD-10-CM | POA: Diagnosis not present

## 2018-06-30 DIAGNOSIS — M542 Cervicalgia: Secondary | ICD-10-CM | POA: Diagnosis not present

## 2018-06-30 DIAGNOSIS — M9981 Other biomechanical lesions of cervical region: Secondary | ICD-10-CM | POA: Diagnosis not present

## 2018-06-30 DIAGNOSIS — Z6825 Body mass index (BMI) 25.0-25.9, adult: Secondary | ICD-10-CM | POA: Diagnosis not present

## 2018-06-30 DIAGNOSIS — R030 Elevated blood-pressure reading, without diagnosis of hypertension: Secondary | ICD-10-CM | POA: Diagnosis not present

## 2018-06-30 DIAGNOSIS — R200 Anesthesia of skin: Secondary | ICD-10-CM | POA: Diagnosis not present

## 2018-06-30 DIAGNOSIS — M5412 Radiculopathy, cervical region: Secondary | ICD-10-CM | POA: Diagnosis not present

## 2018-08-05 DIAGNOSIS — D229 Melanocytic nevi, unspecified: Secondary | ICD-10-CM | POA: Diagnosis not present

## 2018-08-05 DIAGNOSIS — L570 Actinic keratosis: Secondary | ICD-10-CM | POA: Diagnosis not present

## 2018-08-31 DIAGNOSIS — R972 Elevated prostate specific antigen [PSA]: Secondary | ICD-10-CM | POA: Diagnosis not present

## 2018-09-07 DIAGNOSIS — N529 Male erectile dysfunction, unspecified: Secondary | ICD-10-CM | POA: Diagnosis not present

## 2018-09-07 DIAGNOSIS — N400 Enlarged prostate without lower urinary tract symptoms: Secondary | ICD-10-CM | POA: Diagnosis not present

## 2018-09-28 DIAGNOSIS — H40013 Open angle with borderline findings, low risk, bilateral: Secondary | ICD-10-CM | POA: Diagnosis not present

## 2018-10-08 DIAGNOSIS — Z23 Encounter for immunization: Secondary | ICD-10-CM | POA: Diagnosis not present

## 2019-03-18 ENCOUNTER — Other Ambulatory Visit: Payer: Self-pay | Admitting: Endocrinology

## 2019-03-18 DIAGNOSIS — E049 Nontoxic goiter, unspecified: Secondary | ICD-10-CM

## 2019-04-26 DIAGNOSIS — D229 Melanocytic nevi, unspecified: Secondary | ICD-10-CM | POA: Diagnosis not present

## 2019-04-26 DIAGNOSIS — L570 Actinic keratosis: Secondary | ICD-10-CM | POA: Diagnosis not present

## 2019-04-29 DIAGNOSIS — H40012 Open angle with borderline findings, low risk, left eye: Secondary | ICD-10-CM | POA: Diagnosis not present

## 2019-05-02 ENCOUNTER — Other Ambulatory Visit: Payer: Medicare Other

## 2019-05-02 DIAGNOSIS — E049 Nontoxic goiter, unspecified: Secondary | ICD-10-CM | POA: Diagnosis not present

## 2019-05-10 DIAGNOSIS — R5383 Other fatigue: Secondary | ICD-10-CM | POA: Diagnosis not present

## 2019-05-10 DIAGNOSIS — Z1339 Encounter for screening examination for other mental health and behavioral disorders: Secondary | ICD-10-CM | POA: Diagnosis not present

## 2019-05-10 DIAGNOSIS — Z79899 Other long term (current) drug therapy: Secondary | ICD-10-CM | POA: Diagnosis not present

## 2019-05-10 DIAGNOSIS — Z0000 Encounter for general adult medical examination without abnormal findings: Secondary | ICD-10-CM | POA: Diagnosis not present

## 2019-05-10 DIAGNOSIS — E7800 Pure hypercholesterolemia, unspecified: Secondary | ICD-10-CM | POA: Diagnosis not present

## 2019-05-10 DIAGNOSIS — Z7189 Other specified counseling: Secondary | ICD-10-CM | POA: Diagnosis not present

## 2019-05-10 DIAGNOSIS — Z125 Encounter for screening for malignant neoplasm of prostate: Secondary | ICD-10-CM | POA: Diagnosis not present

## 2019-05-10 DIAGNOSIS — Z1211 Encounter for screening for malignant neoplasm of colon: Secondary | ICD-10-CM | POA: Diagnosis not present

## 2019-05-10 DIAGNOSIS — Z299 Encounter for prophylactic measures, unspecified: Secondary | ICD-10-CM | POA: Diagnosis not present

## 2019-05-10 DIAGNOSIS — Z6825 Body mass index (BMI) 25.0-25.9, adult: Secondary | ICD-10-CM | POA: Diagnosis not present

## 2019-05-10 DIAGNOSIS — Z1331 Encounter for screening for depression: Secondary | ICD-10-CM | POA: Diagnosis not present

## 2019-05-10 DIAGNOSIS — I6529 Occlusion and stenosis of unspecified carotid artery: Secondary | ICD-10-CM | POA: Diagnosis not present

## 2019-05-11 ENCOUNTER — Ambulatory Visit
Admission: RE | Admit: 2019-05-11 | Discharge: 2019-05-11 | Disposition: A | Discharge status: Home / Self Care | Payer: Medicare Other | Source: Ambulatory Visit | Attending: Endocrinology | Admitting: Endocrinology

## 2019-05-11 DIAGNOSIS — E041 Nontoxic single thyroid nodule: Secondary | ICD-10-CM | POA: Diagnosis not present

## 2019-05-11 DIAGNOSIS — E049 Nontoxic goiter, unspecified: Secondary | ICD-10-CM

## 2019-05-17 DIAGNOSIS — E049 Nontoxic goiter, unspecified: Secondary | ICD-10-CM | POA: Diagnosis not present

## 2019-05-17 DIAGNOSIS — E02 Subclinical iodine-deficiency hypothyroidism: Secondary | ICD-10-CM | POA: Diagnosis not present

## 2019-06-28 DIAGNOSIS — H00025 Hordeolum internum left lower eyelid: Secondary | ICD-10-CM | POA: Diagnosis not present

## 2019-06-29 DIAGNOSIS — Z1211 Encounter for screening for malignant neoplasm of colon: Secondary | ICD-10-CM | POA: Diagnosis not present

## 2019-07-04 ENCOUNTER — Other Ambulatory Visit: Payer: Self-pay

## 2019-07-12 DIAGNOSIS — Z23 Encounter for immunization: Secondary | ICD-10-CM | POA: Diagnosis not present

## 2019-08-09 DIAGNOSIS — Z01812 Encounter for preprocedural laboratory examination: Secondary | ICD-10-CM | POA: Diagnosis not present

## 2019-08-09 DIAGNOSIS — Z20828 Contact with and (suspected) exposure to other viral communicable diseases: Secondary | ICD-10-CM | POA: Diagnosis not present

## 2019-08-11 DIAGNOSIS — K644 Residual hemorrhoidal skin tags: Secondary | ICD-10-CM | POA: Diagnosis not present

## 2019-08-11 DIAGNOSIS — K5730 Diverticulosis of large intestine without perforation or abscess without bleeding: Secondary | ICD-10-CM | POA: Diagnosis not present

## 2019-08-11 DIAGNOSIS — Z1211 Encounter for screening for malignant neoplasm of colon: Secondary | ICD-10-CM | POA: Diagnosis not present

## 2019-08-11 DIAGNOSIS — E041 Nontoxic single thyroid nodule: Secondary | ICD-10-CM | POA: Diagnosis not present

## 2019-08-11 DIAGNOSIS — M1990 Unspecified osteoarthritis, unspecified site: Secondary | ICD-10-CM | POA: Diagnosis not present

## 2019-08-11 DIAGNOSIS — K640 First degree hemorrhoids: Secondary | ICD-10-CM | POA: Diagnosis not present

## 2019-09-02 DIAGNOSIS — N400 Enlarged prostate without lower urinary tract symptoms: Secondary | ICD-10-CM | POA: Diagnosis not present

## 2019-09-08 DIAGNOSIS — N400 Enlarged prostate without lower urinary tract symptoms: Secondary | ICD-10-CM | POA: Diagnosis not present

## 2019-09-08 DIAGNOSIS — R972 Elevated prostate specific antigen [PSA]: Secondary | ICD-10-CM | POA: Diagnosis not present

## 2019-09-08 DIAGNOSIS — N529 Male erectile dysfunction, unspecified: Secondary | ICD-10-CM | POA: Diagnosis not present

## 2019-10-18 DIAGNOSIS — H0019 Chalazion unspecified eye, unspecified eyelid: Secondary | ICD-10-CM | POA: Diagnosis not present

## 2019-10-18 DIAGNOSIS — L570 Actinic keratosis: Secondary | ICD-10-CM | POA: Diagnosis not present

## 2019-10-18 DIAGNOSIS — D229 Melanocytic nevi, unspecified: Secondary | ICD-10-CM | POA: Diagnosis not present

## 2020-03-22 IMAGING — US US THYROID
1 series · 12 of 25 positions shown · non-contrast
Comparison: 04/23/2017 and multiple prior exams.

CLINICAL DATA: Prior ultrasound follow-up. Status post prior
fine-needle aspiration of bilateral thyroid nodules on 05/23/2018
with right nodule demonstrating findings consistent with
hyperplastic nodule and left nodule demonstrating non-neoplastic
goiter.

EXAM:
THYROID ULTRASOUND
TECHNIQUE: Ultrasound examination of the thyroid gland and adjacent soft
tissues was performed.

[Series 1: us thyroid · 0.04mm/px · 12 of 58 slices shown]
[im 3/58]
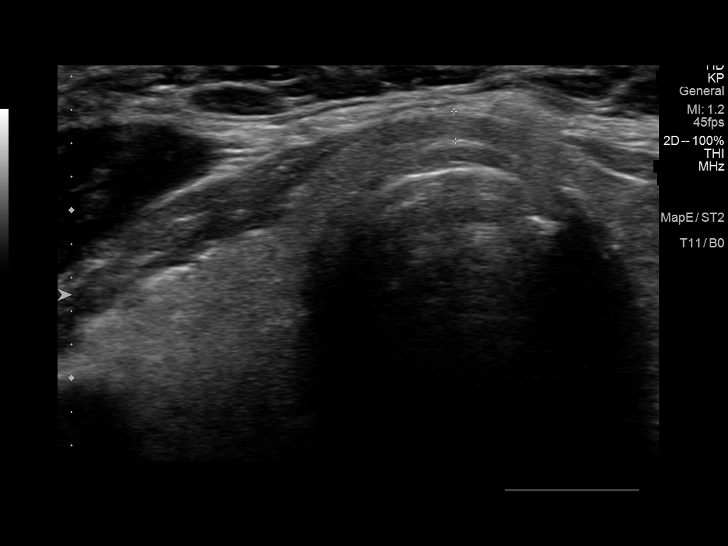
[im 8/58]
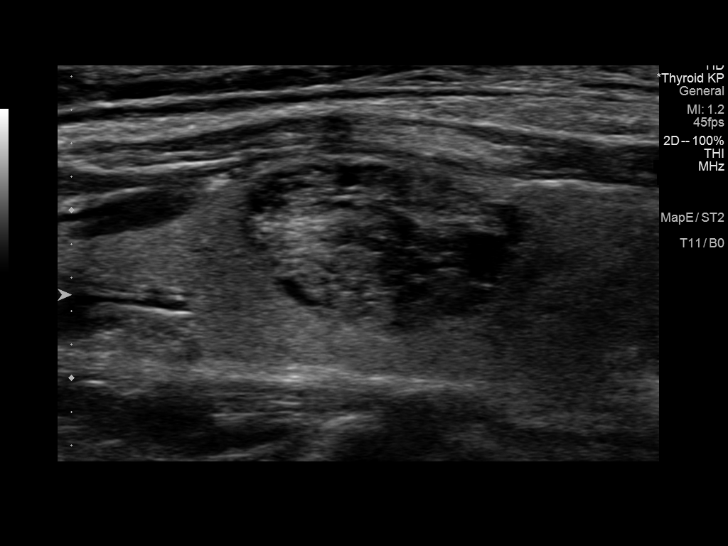
[im 12/58]
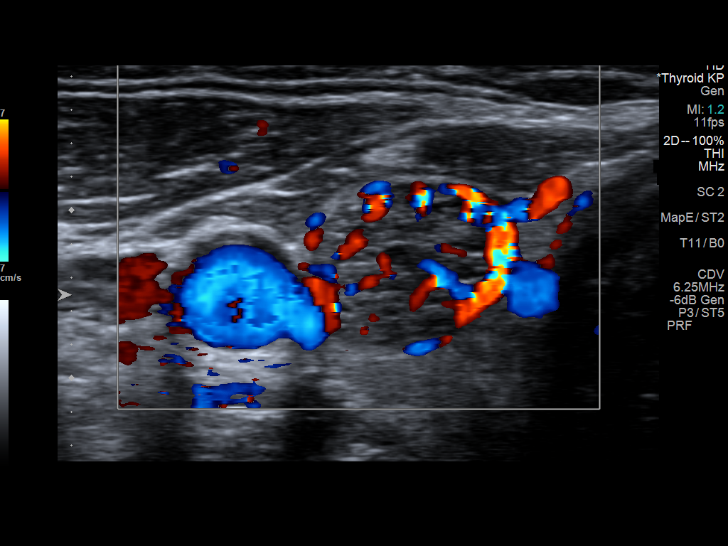
[im 17/58]
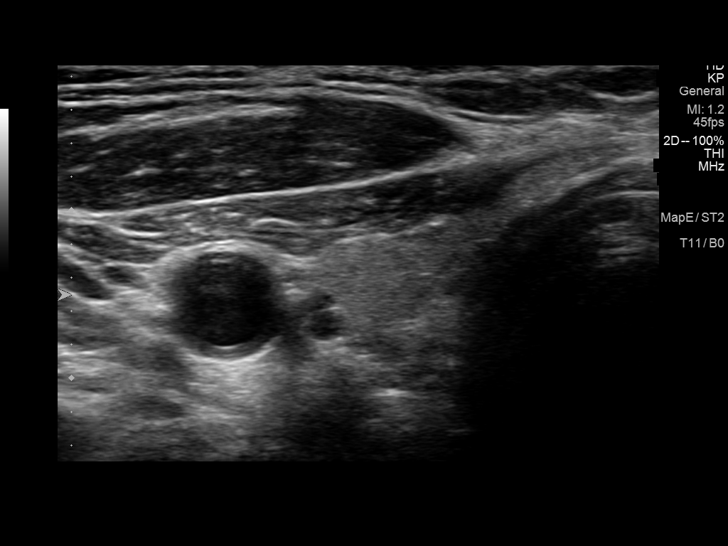
[im 22/58]
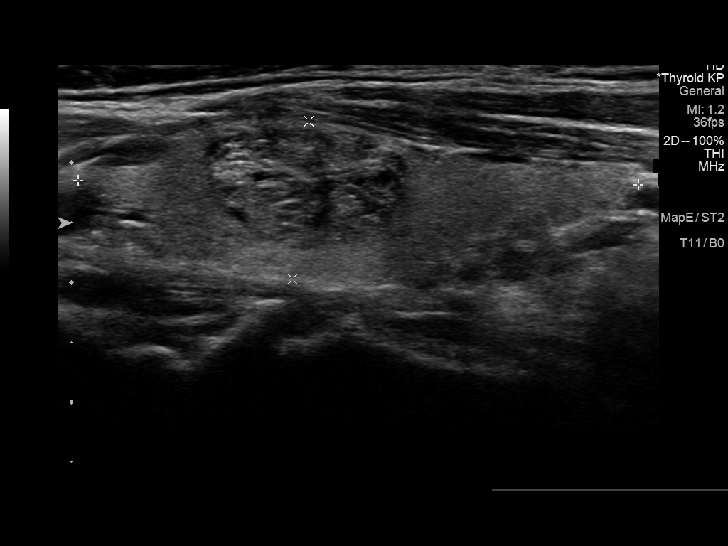
[im 27/58]
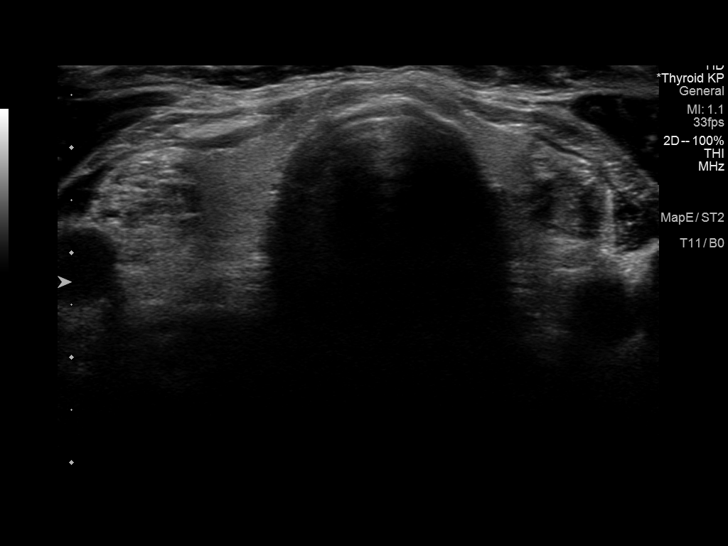
[im 31/58]
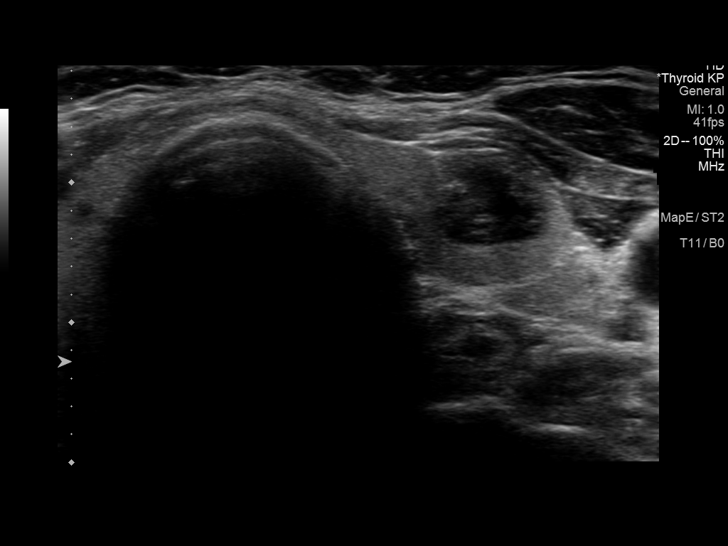
[im 36/58]
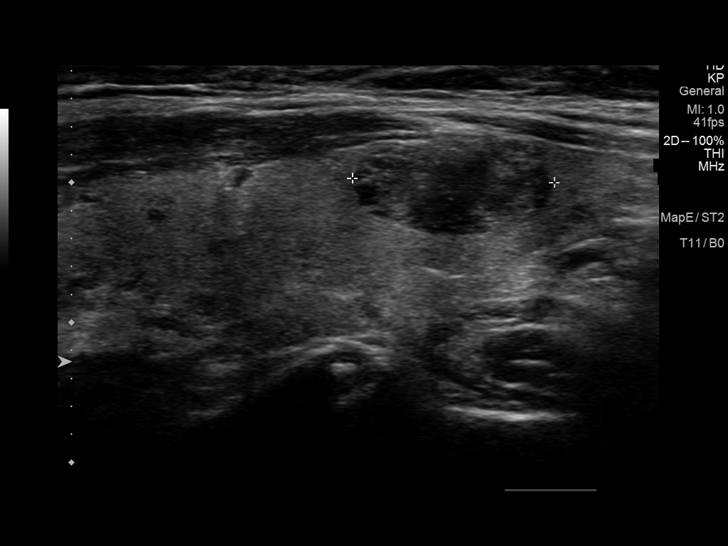
[im 41/58]
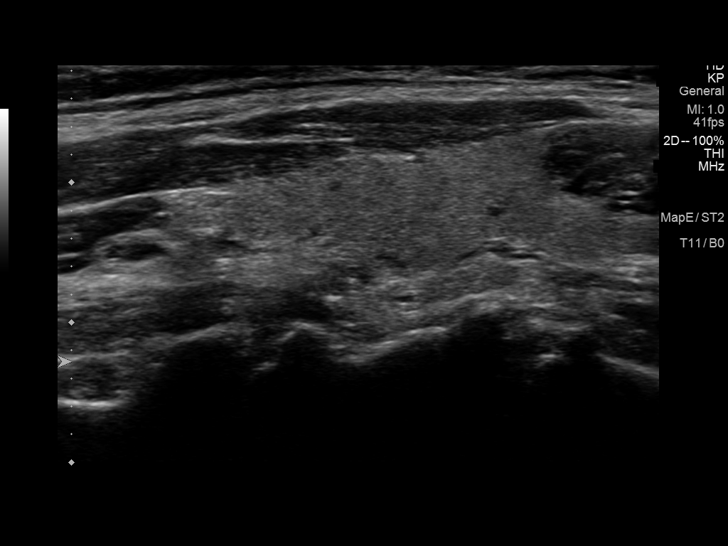
[im 46/58]
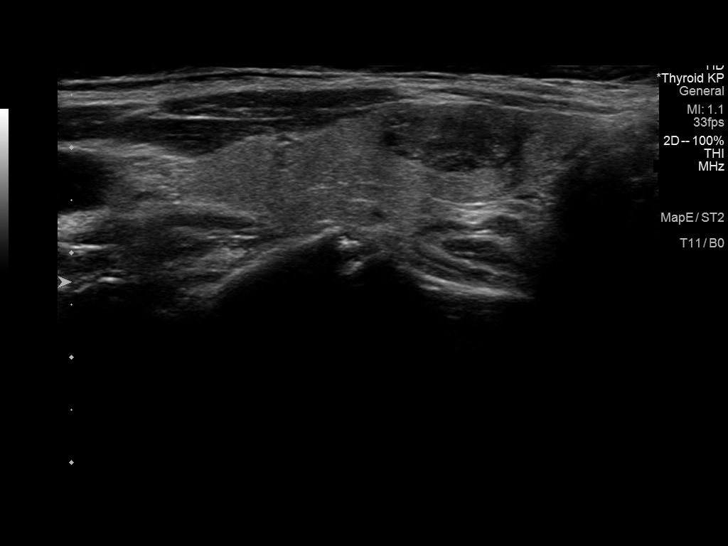
[im 50/58]
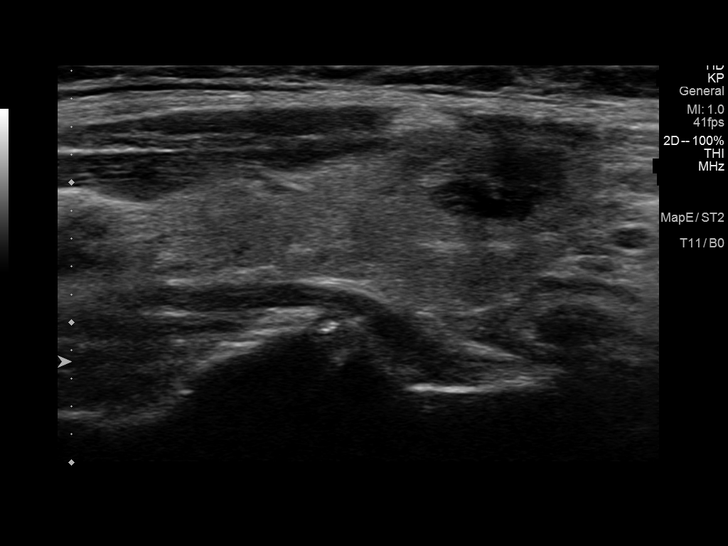
[im 55/58]
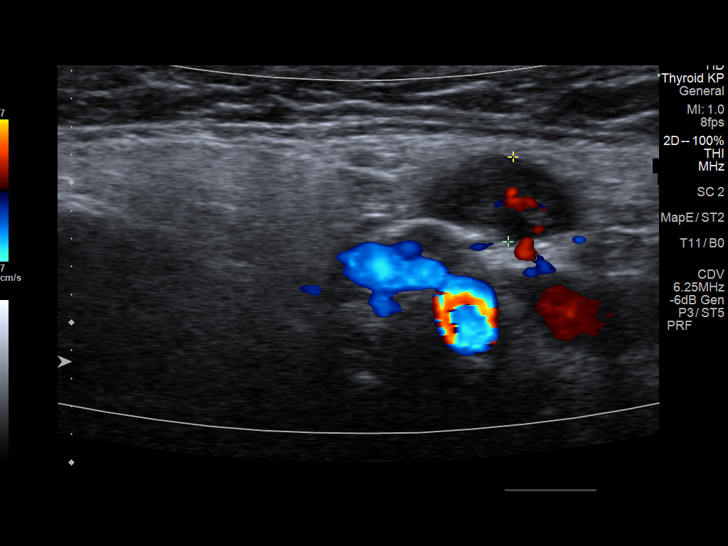

[12 of 25 positions shown; findings below may reference images not displayed]

FINDINGS: Parenchymal Echotexture: Mildly heterogenous

Isthmus: 0.2 cm

Right lobe: 4.7 x 1.3 x 1.5 cm

Left lobe: 4.6 x 1.2 x 1.4 cm

_________________________________________________________

Estimated total number of nodules >/= 1 cm: 2

Number of spongiform nodules >/=  2 cm not described below (TR1): 0

Number of mixed cystic and solid nodules >/= 1.5 cm not described
below (TR2): 0

_________________________________________________________

Nodule # 1:

Prior biopsy: Yes

Location: Right; Superior

Maximum size: 1.7 cm; Other 2 dimensions: 1.1 x 0.9 cm, previously,
1.6 cm

Composition: spongiform (0)

Echogenicity: anechoic (0)

Shape: not taller-than-wide (0)

Margins: smooth (0)

Echogenic foci: none (0)

ACR TI-RADS total points: 0.

ACR TI-RADS risk category:  TR1 (0-1 points).

Significant change in size (>/= 20% in two dimensions and minimal
increase of 2 mm): No

Change in features: Yes

Change in ACR TI-RADS risk category: Yes

ACR TI-RADS recommendations:

This nodule does NOT meet TI-RADS criteria for biopsy or dedicated
follow-up. The nodule is more clearly spongiform and also appears
spongiform previously. Size and morphology appears stable compared
to the prior study with overall slight enlargement since 1555 and
1424 at which time maximal diameter was is roughly 1.3 cm.

_________________________________________________________

Nodule # 2:

Prior biopsy: Yes

Location: Left; Inferior

Maximum size: 1.4 cm; Other 2 dimensions: 0.8 x 0.7 cm, previously,
1.4 x 1.2 x 0.7 cm

Composition: mixed cystic and solid (1)

Echogenicity: isoechoic (1)

Shape: not taller-than-wide (0)

Margins: smooth (0)

Echogenic foci: none (0)

ACR TI-RADS total points: 2.

ACR TI-RADS risk category:  TR2 (2 points).

Significant change in size (>/= 20% in two dimensions and minimal
increase of 2 mm): No

Change in features: No

Change in ACR TI-RADS risk category: No

ACR TI-RADS recommendations:

This nodule does NOT meet TI-RADS criteria for biopsy or dedicated
follow-up. This nodule appears slightly smaller especially in
transverse dimensions compared to the prior study.

_________________________________________________________
IMPRESSION: 1. 1.7 cm right superior nodule appears stable since the prior study
and spongiform in appearance. This nodule does not meet criteria for
biopsy. The nodule is slightly larger compared to 1555/1424.
2. Left inferior thyroid nodule shows stable maximal dimensions of
1.4 cm but slightly smaller transverse dimensions. The nodule does
not meet criteria for biopsy.

The above is in keeping with the ACR TI-RADS recommendations - [HOSPITAL] 8553;[DATE].

## 2020-03-30 ENCOUNTER — Other Ambulatory Visit: Payer: Self-pay | Admitting: Endocrinology

## 2020-03-30 DIAGNOSIS — E049 Nontoxic goiter, unspecified: Secondary | ICD-10-CM

## 2020-04-03 ENCOUNTER — Ambulatory Visit: Payer: Medicare Other | Admitting: Physician Assistant

## 2020-04-05 ENCOUNTER — Other Ambulatory Visit: Payer: Self-pay

## 2020-04-05 ENCOUNTER — Encounter: Payer: Self-pay | Admitting: Physician Assistant

## 2020-04-05 ENCOUNTER — Ambulatory Visit (INDEPENDENT_AMBULATORY_CARE_PROVIDER_SITE_OTHER): Payer: Medicare Other | Admitting: Physician Assistant

## 2020-04-05 DIAGNOSIS — L570 Actinic keratosis: Secondary | ICD-10-CM | POA: Diagnosis not present

## 2020-04-05 DIAGNOSIS — Z85828 Personal history of other malignant neoplasm of skin: Secondary | ICD-10-CM

## 2020-04-05 DIAGNOSIS — Z1283 Encounter for screening for malignant neoplasm of skin: Secondary | ICD-10-CM

## 2020-04-05 NOTE — Patient Instructions (Signed)

## 2020-04-05 NOTE — Progress Notes (Signed)
   Follow-Up Visit   Subjective  Anthony Cline is a 74 y.o. male who presents for the following: Annual Exam (left forearm crust for months).  Location: left forearm/scalp/face Duration: 3 months Quality: pink scales Associated Signs/Symptoms: none Modifying Factors: persistent Severity: stable Context: he has used 5FU in the past   The following portions of the chart were reviewed this encounter and updated as appropriate: Tobacco  Allergies  Meds  Problems  Med Hx  Surg Hx  Fam Hx      Objective  Well appearing patient in no apparent distress; mood and affect are within normal limits.  All skin waist up examined.  Objective  Left Forearm - Anterior, Left Superior Crus of Antihelix, Mid Parietal Scalp (5), Right Postauricular Area, Right Scaphoid Fossa: Erythematous patches with gritty scale.Erythematous patches with gritty scale.  Objective  waist up: No atypical moles  Assessment & Plan  AK (actinic keratosis) (9) Left Forearm - Anterior; Right Scaphoid Fossa; Left Superior Crus of Antihelix; Mid Parietal Scalp (5); Right Postauricular Area  Destruction of lesion - Left Forearm - Anterior, Left Superior Crus of Antihelix, Mid Parietal Scalp (5), Right Postauricular Area, Right Scaphoid Fossa Complexity: simple   Destruction method: cryotherapy   Informed consent: discussed and consent obtained   Timeout:  patient name, date of birth, surgical site, and procedure verified Lesion destroyed using liquid nitrogen: Yes   Cryotherapy cycles:  1 Outcome: patient tolerated procedure well with no complications   Post-procedure details: wound care instructions given    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

## 2020-04-10 ENCOUNTER — Ambulatory Visit
Admission: RE | Admit: 2020-04-10 | Discharge: 2020-04-10 | Disposition: A | Discharge status: Home / Self Care | Payer: Medicare Other | Source: Ambulatory Visit | Attending: Endocrinology | Admitting: Endocrinology

## 2020-04-10 DIAGNOSIS — E041 Nontoxic single thyroid nodule: Secondary | ICD-10-CM | POA: Diagnosis not present

## 2020-04-10 DIAGNOSIS — E049 Nontoxic goiter, unspecified: Secondary | ICD-10-CM

## 2020-05-08 DIAGNOSIS — E049 Nontoxic goiter, unspecified: Secondary | ICD-10-CM | POA: Diagnosis not present

## 2020-05-10 DIAGNOSIS — H00014 Hordeolum externum left upper eyelid: Secondary | ICD-10-CM | POA: Diagnosis not present

## 2020-05-15 DIAGNOSIS — R5383 Other fatigue: Secondary | ICD-10-CM | POA: Diagnosis not present

## 2020-05-15 DIAGNOSIS — Z125 Encounter for screening for malignant neoplasm of prostate: Secondary | ICD-10-CM | POA: Diagnosis not present

## 2020-05-15 DIAGNOSIS — N400 Enlarged prostate without lower urinary tract symptoms: Secondary | ICD-10-CM | POA: Diagnosis not present

## 2020-05-15 DIAGNOSIS — Z1331 Encounter for screening for depression: Secondary | ICD-10-CM | POA: Diagnosis not present

## 2020-05-15 DIAGNOSIS — Z7189 Other specified counseling: Secondary | ICD-10-CM | POA: Diagnosis not present

## 2020-05-15 DIAGNOSIS — E7800 Pure hypercholesterolemia, unspecified: Secondary | ICD-10-CM | POA: Diagnosis not present

## 2020-05-15 DIAGNOSIS — Z0000 Encounter for general adult medical examination without abnormal findings: Secondary | ICD-10-CM | POA: Diagnosis not present

## 2020-05-15 DIAGNOSIS — Z79899 Other long term (current) drug therapy: Secondary | ICD-10-CM | POA: Diagnosis not present

## 2020-05-15 DIAGNOSIS — Z6824 Body mass index (BMI) 24.0-24.9, adult: Secondary | ICD-10-CM | POA: Diagnosis not present

## 2020-05-15 DIAGNOSIS — Z299 Encounter for prophylactic measures, unspecified: Secondary | ICD-10-CM | POA: Diagnosis not present

## 2020-05-15 DIAGNOSIS — Z1339 Encounter for screening examination for other mental health and behavioral disorders: Secondary | ICD-10-CM | POA: Diagnosis not present

## 2020-08-14 DIAGNOSIS — Z23 Encounter for immunization: Secondary | ICD-10-CM | POA: Diagnosis not present

## 2020-08-30 DIAGNOSIS — R972 Elevated prostate specific antigen [PSA]: Secondary | ICD-10-CM | POA: Diagnosis not present

## 2020-09-11 DIAGNOSIS — H40013 Open angle with borderline findings, low risk, bilateral: Secondary | ICD-10-CM | POA: Diagnosis not present

## 2020-09-19 DIAGNOSIS — R972 Elevated prostate specific antigen [PSA]: Secondary | ICD-10-CM | POA: Diagnosis not present

## 2020-09-19 DIAGNOSIS — N400 Enlarged prostate without lower urinary tract symptoms: Secondary | ICD-10-CM | POA: Diagnosis not present

## 2020-10-09 ENCOUNTER — Ambulatory Visit: Payer: Medicare Other | Admitting: Physician Assistant

## 2020-12-10 DIAGNOSIS — H1045 Other chronic allergic conjunctivitis: Secondary | ICD-10-CM | POA: Diagnosis not present

## 2020-12-14 ENCOUNTER — Ambulatory Visit: Payer: Medicare Other | Admitting: Physician Assistant

## 2021-01-25 DIAGNOSIS — Z23 Encounter for immunization: Secondary | ICD-10-CM | POA: Diagnosis not present

## 2021-02-05 ENCOUNTER — Encounter: Payer: Self-pay | Admitting: Physician Assistant

## 2021-02-05 ENCOUNTER — Ambulatory Visit (INDEPENDENT_AMBULATORY_CARE_PROVIDER_SITE_OTHER): Payer: Medicare Other | Admitting: Physician Assistant

## 2021-02-05 ENCOUNTER — Other Ambulatory Visit: Payer: Self-pay

## 2021-02-05 DIAGNOSIS — Z85828 Personal history of other malignant neoplasm of skin: Secondary | ICD-10-CM | POA: Diagnosis not present

## 2021-02-05 DIAGNOSIS — L570 Actinic keratosis: Secondary | ICD-10-CM

## 2021-02-05 DIAGNOSIS — Z1283 Encounter for screening for malignant neoplasm of skin: Secondary | ICD-10-CM | POA: Diagnosis not present

## 2021-02-05 DIAGNOSIS — D1800 Hemangioma unspecified site: Secondary | ICD-10-CM

## 2021-02-05 DIAGNOSIS — L821 Other seborrheic keratosis: Secondary | ICD-10-CM | POA: Diagnosis not present

## 2021-02-19 ENCOUNTER — Encounter: Payer: Self-pay | Admitting: Physician Assistant

## 2021-02-19 DIAGNOSIS — Z23 Encounter for immunization: Secondary | ICD-10-CM | POA: Diagnosis not present

## 2021-02-19 NOTE — Progress Notes (Signed)
   Follow-Up Visit   Subjective  Anthony Cline is a 75 y.o. male who presents for the following: Follow-up (Patient here today for 6 month skin check. Per patient he has a bump on the bridge of his nose x 1.5 months no bleeding, no crust, no pain. Patient states he has a couple of places on his right forearm he'd like checked.).   The following portions of the chart were reviewed this encounter and updated as appropriate:  Tobacco  Allergies  Meds  Problems  Med Hx  Surg Hx  Fam Hx      Objective  Well appearing patient in no apparent distress; mood and affect are within normal limits.  All skin waist up examined.  Objective  Left Abdomen (side) - Lower: Full body skin examination  Objective  Right Forehead: White scar- clear  Objective  Right Upper Back: Multiple red raised papule  Objective  Left Upper Back: Multiple Stuck-on, waxy, tan-brown papules and plaques. --Discussed benign etiology and prognosis.   Objective  Left Dorsal Hand, Left Malar Cheek (2), Left Superior Helix, Right Antecubital Fossa, Right Anterior Helix, Right Dorsal Hand, Right Forearm - Anterior, Right Forehead (2), Right Nasal Sidewall, Right Root of Nose: Erythematous patches with gritty scale.   Assessment & Plan  Encounter for screening for malignant neoplasm of skin Left Abdomen (side) - Lower  Yearly skin check  History of squamous cell carcinoma of skin Right Forehead  Yearly skin check  Hemangioma, unspecified site Right Upper Back  Okay to leave if stable  Seborrheic keratosis Left Upper Back  Okay to leave if stable  AK (actinic keratosis) (12) Right Antecubital Fossa; Right Forearm - Anterior; Left Dorsal Hand; Right Dorsal Hand; Left Superior Helix; Right Anterior Helix; Right Root of Nose; Right Nasal Sidewall; Left Malar Cheek (2); Right Forehead (2)  Destruction of lesion - Left Dorsal Hand, Left Malar Cheek, Left Superior Helix, Right Antecubital Fossa, Right  Anterior Helix, Right Dorsal Hand, Right Forearm - Anterior, Right Forehead (2), Right Nasal Sidewall, Right Root of Nose Complexity: simple   Destruction method: cryotherapy   Informed consent: discussed and consent obtained   Timeout:  patient name, date of birth, surgical site, and procedure verified Lesion destroyed using liquid nitrogen: Yes   Cryotherapy cycles:  3 Outcome: patient tolerated procedure well with no complications      I, Lamara Brecht, PA-C, have reviewed all documentation's for this visit.  The documentation on 02/19/21 for the exam, diagnosis, procedures and orders are all accurate and complete.

## 2021-02-20 IMAGING — US US THYROID
1 series · 13 of 25 positions shown · non-contrast
Comparison: Most recent prior thyroid ultrasound 05/11/2019

CLINICAL DATA: Goiter. 73-year-old male with a history of multiple
thyroid nodules. Patient underwent previous fine-needle aspiration
biopsy of bilateral thyroid nodules in Tuesday May, 2018. The right-sided
thyroid nodule pathology was consistent with a hyperplastic nodule
while the left was consistent with a non neoplastic goiter.

EXAM:
THYROID ULTRASOUND
TECHNIQUE: Ultrasound examination of the thyroid gland and adjacent soft
tissues was performed.

[Series 1: us thyroid · 0.06mm/px · 13 of 45 slices shown]
[im 1/45]
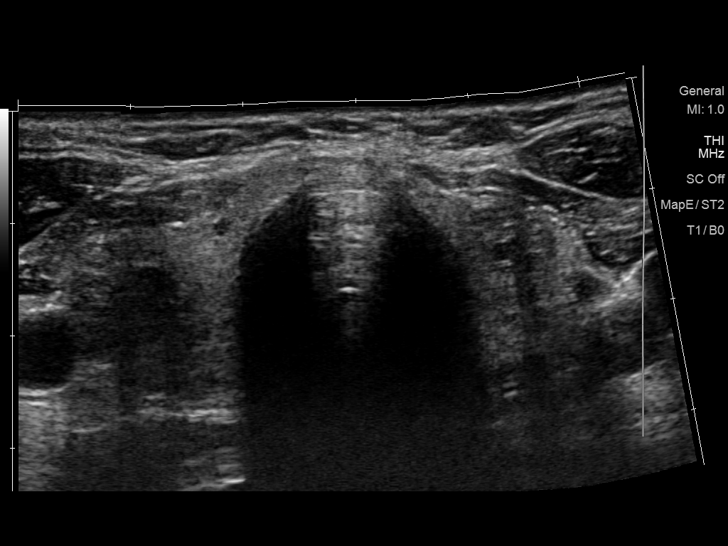
[im 4/45]
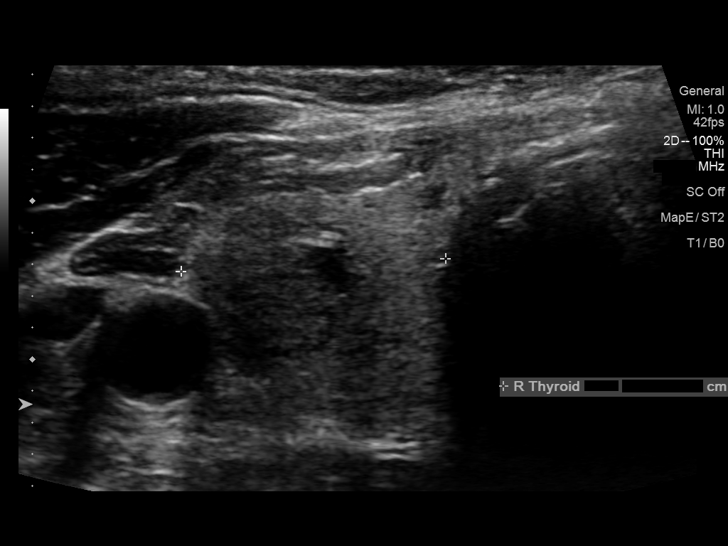
[im 8/45]
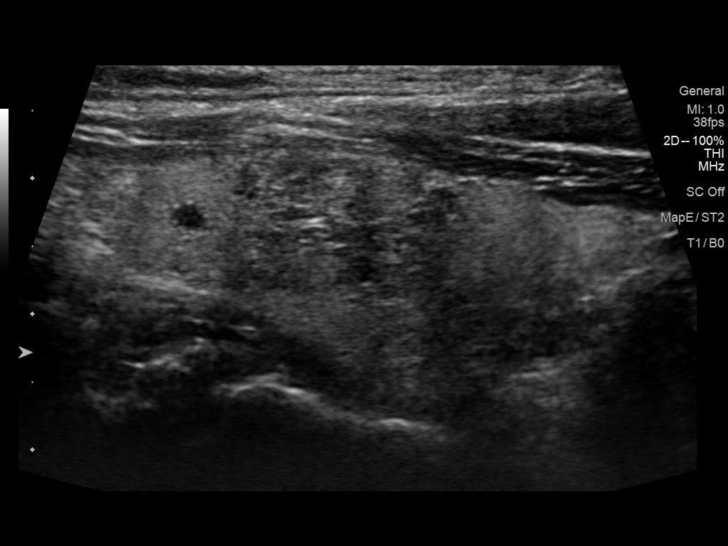
[im 12/45]
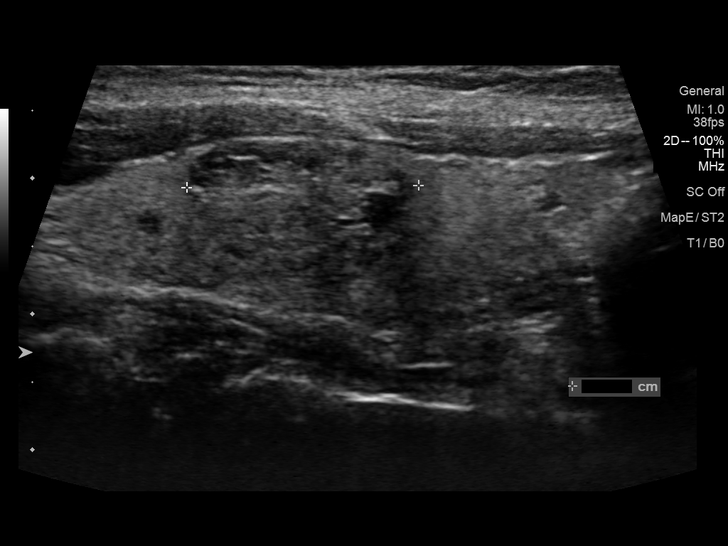
[im 15/45]
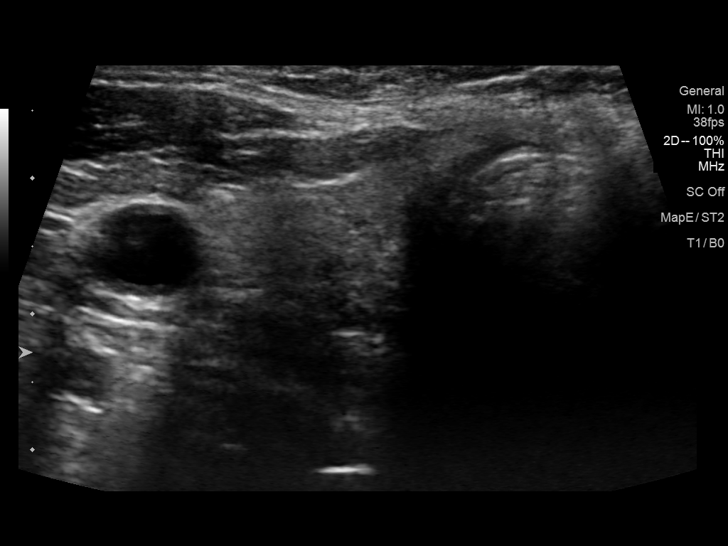
[im 19/45]
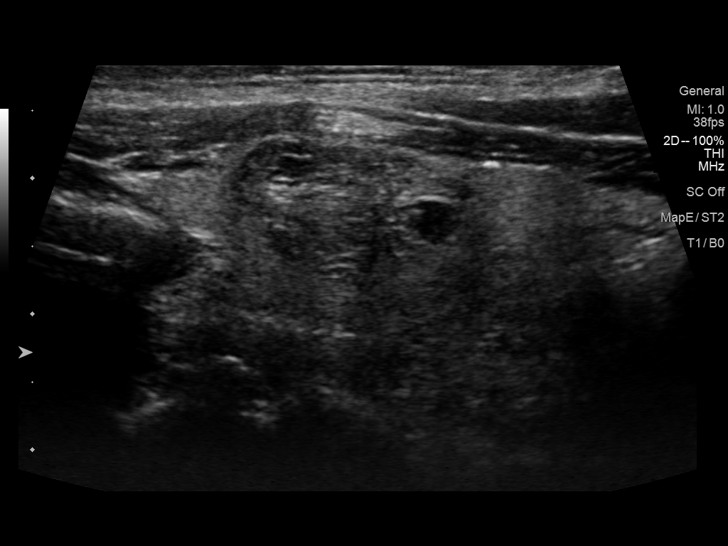
[im 23/45]
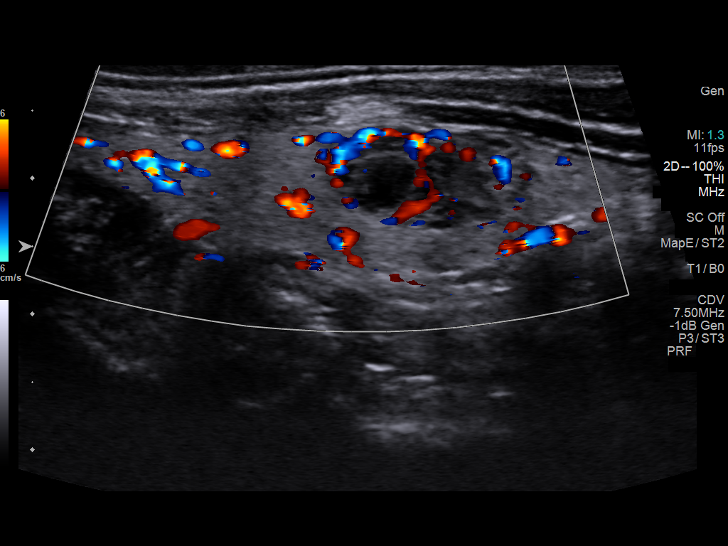
[im 26/45]
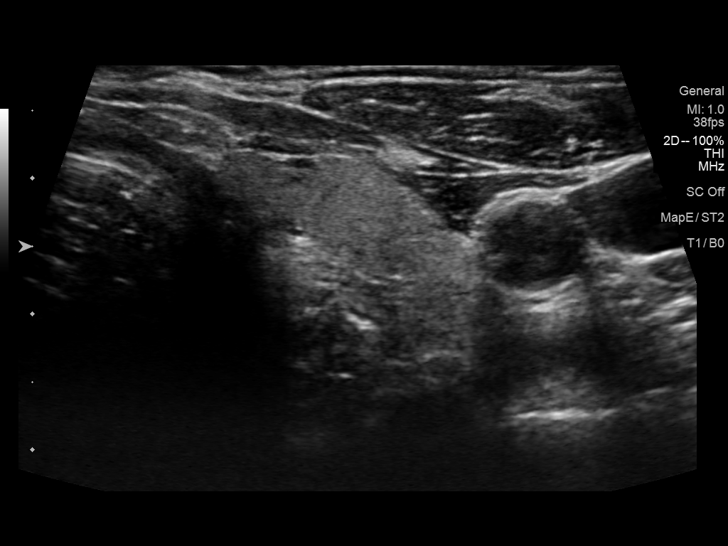
[im 30/45]
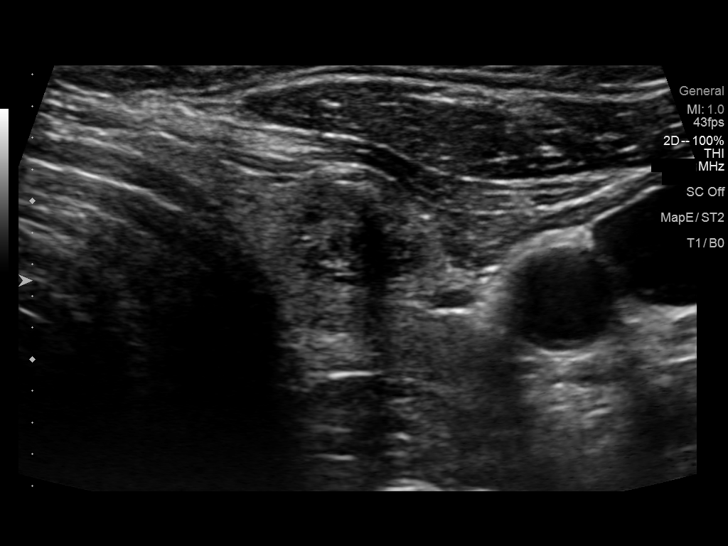
[im 34/45]
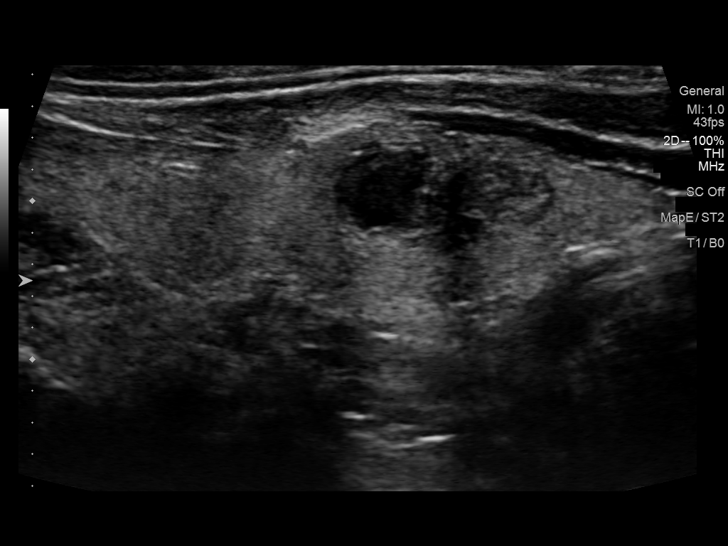
[im 37/45]
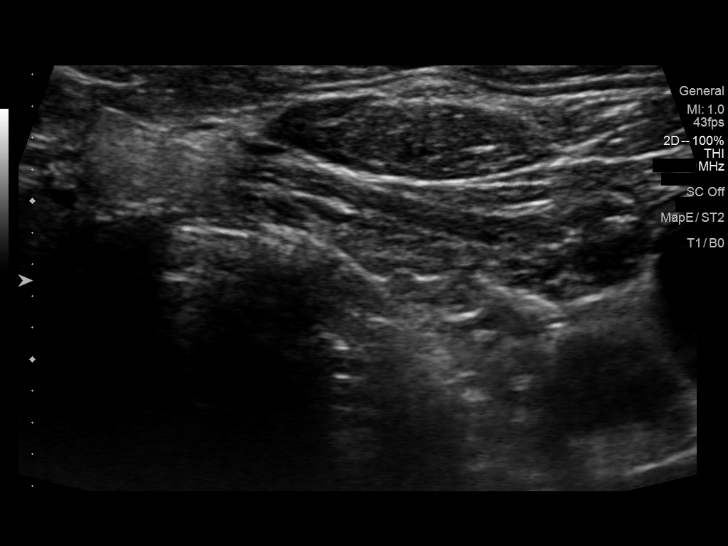
[im 41/45]
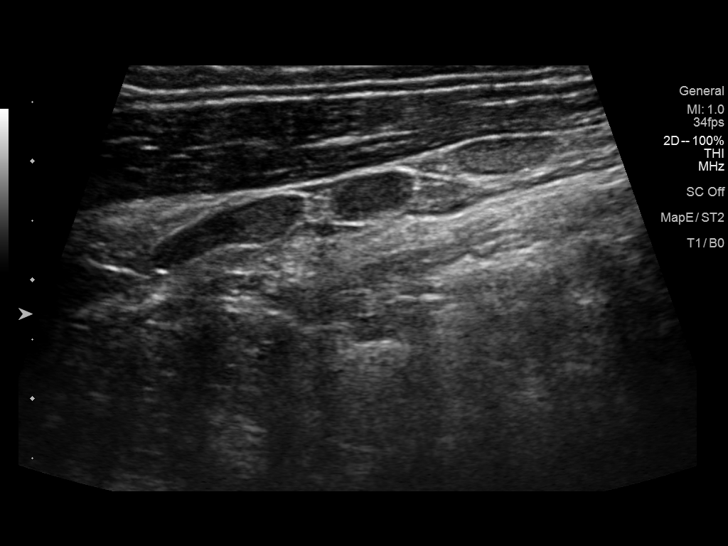
[im 45/45]
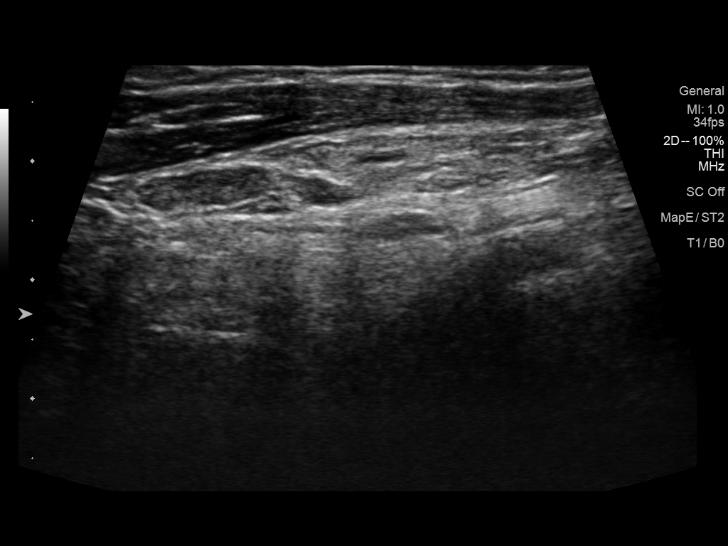

[13 of 25 positions shown; findings below may reference images not displayed]

FINDINGS: Parenchymal Echotexture: Moderately heterogenous

Isthmus: 0.2 cm

Right lobe: 4.5 x 1.8 x 1.7 cm

Left lobe: 4.3 x 1.4 x 1.2 cm

_________________________________________________________

Estimated total number of nodules >/= 1 cm: 2

Number of spongiform nodules >/=  2 cm not described below (TR1): 0

Number of mixed cystic and solid nodules >/= 1.5 cm not described
below (TR2): 0

_________________________________________________________

Nodule # 1: The previously biopsied nodule in the right mid gland
remains unchanged at 1.7 x 1.1 x 1.0 cm.

Nodule # 2: The previously biopsied nodule in the left mid gland
remains unchanged at 1.4 x 0.8 x 0.8 cm.

New no new nodules or suspicious features are identified.
IMPRESSION: No significant interval change in the size, appearance or morphology
of the previously biopsied bilateral thyroid nodules.

No new nodules or suspicious findings.

## 2021-03-11 DIAGNOSIS — M79672 Pain in left foot: Secondary | ICD-10-CM | POA: Diagnosis not present

## 2021-03-11 DIAGNOSIS — M722 Plantar fascial fibromatosis: Secondary | ICD-10-CM | POA: Diagnosis not present

## 2021-03-11 DIAGNOSIS — M7732 Calcaneal spur, left foot: Secondary | ICD-10-CM | POA: Diagnosis not present

## 2021-03-12 DIAGNOSIS — H40013 Open angle with borderline findings, low risk, bilateral: Secondary | ICD-10-CM | POA: Diagnosis not present

## 2021-04-02 DIAGNOSIS — M7732 Calcaneal spur, left foot: Secondary | ICD-10-CM | POA: Diagnosis not present

## 2021-04-02 DIAGNOSIS — M79671 Pain in right foot: Secondary | ICD-10-CM | POA: Diagnosis not present

## 2021-04-02 DIAGNOSIS — M722 Plantar fascial fibromatosis: Secondary | ICD-10-CM | POA: Diagnosis not present

## 2021-04-02 DIAGNOSIS — M79672 Pain in left foot: Secondary | ICD-10-CM | POA: Diagnosis not present

## 2021-04-23 DIAGNOSIS — M722 Plantar fascial fibromatosis: Secondary | ICD-10-CM | POA: Diagnosis not present

## 2021-04-23 DIAGNOSIS — M79671 Pain in right foot: Secondary | ICD-10-CM | POA: Diagnosis not present

## 2021-04-23 DIAGNOSIS — M7732 Calcaneal spur, left foot: Secondary | ICD-10-CM | POA: Diagnosis not present

## 2021-04-23 DIAGNOSIS — M79672 Pain in left foot: Secondary | ICD-10-CM | POA: Diagnosis not present

## 2021-05-09 DIAGNOSIS — E02 Subclinical iodine-deficiency hypothyroidism: Secondary | ICD-10-CM | POA: Diagnosis not present

## 2021-05-09 DIAGNOSIS — E049 Nontoxic goiter, unspecified: Secondary | ICD-10-CM | POA: Diagnosis not present

## 2021-05-14 DIAGNOSIS — E049 Nontoxic goiter, unspecified: Secondary | ICD-10-CM | POA: Diagnosis not present

## 2021-05-14 DIAGNOSIS — E02 Subclinical iodine-deficiency hypothyroidism: Secondary | ICD-10-CM | POA: Diagnosis not present

## 2021-05-21 DIAGNOSIS — Z7189 Other specified counseling: Secondary | ICD-10-CM | POA: Diagnosis not present

## 2021-05-21 DIAGNOSIS — E7800 Pure hypercholesterolemia, unspecified: Secondary | ICD-10-CM | POA: Diagnosis not present

## 2021-05-21 DIAGNOSIS — Z79899 Other long term (current) drug therapy: Secondary | ICD-10-CM | POA: Diagnosis not present

## 2021-05-21 DIAGNOSIS — Z0000 Encounter for general adult medical examination without abnormal findings: Secondary | ICD-10-CM | POA: Diagnosis not present

## 2021-05-21 DIAGNOSIS — Z1339 Encounter for screening examination for other mental health and behavioral disorders: Secondary | ICD-10-CM | POA: Diagnosis not present

## 2021-05-21 DIAGNOSIS — Z6824 Body mass index (BMI) 24.0-24.9, adult: Secondary | ICD-10-CM | POA: Diagnosis not present

## 2021-05-21 DIAGNOSIS — Z125 Encounter for screening for malignant neoplasm of prostate: Secondary | ICD-10-CM | POA: Diagnosis not present

## 2021-05-21 DIAGNOSIS — Z1331 Encounter for screening for depression: Secondary | ICD-10-CM | POA: Diagnosis not present

## 2021-05-21 DIAGNOSIS — Z299 Encounter for prophylactic measures, unspecified: Secondary | ICD-10-CM | POA: Diagnosis not present

## 2021-05-21 DIAGNOSIS — R5383 Other fatigue: Secondary | ICD-10-CM | POA: Diagnosis not present

## 2021-08-08 DIAGNOSIS — Z20828 Contact with and (suspected) exposure to other viral communicable diseases: Secondary | ICD-10-CM | POA: Diagnosis not present

## 2021-08-08 DIAGNOSIS — Z23 Encounter for immunization: Secondary | ICD-10-CM | POA: Diagnosis not present

## 2021-08-13 ENCOUNTER — Encounter: Payer: Self-pay | Admitting: Physician Assistant

## 2021-08-13 ENCOUNTER — Ambulatory Visit (INDEPENDENT_AMBULATORY_CARE_PROVIDER_SITE_OTHER): Payer: Medicare Other | Admitting: Physician Assistant

## 2021-08-13 ENCOUNTER — Other Ambulatory Visit: Payer: Self-pay

## 2021-08-13 DIAGNOSIS — Z1283 Encounter for screening for malignant neoplasm of skin: Secondary | ICD-10-CM

## 2021-08-13 DIAGNOSIS — Z808 Family history of malignant neoplasm of other organs or systems: Secondary | ICD-10-CM

## 2021-08-13 DIAGNOSIS — L570 Actinic keratosis: Secondary | ICD-10-CM

## 2021-08-13 DIAGNOSIS — Z85828 Personal history of other malignant neoplasm of skin: Secondary | ICD-10-CM

## 2021-08-13 DIAGNOSIS — L820 Inflamed seborrheic keratosis: Secondary | ICD-10-CM

## 2021-08-19 ENCOUNTER — Encounter: Payer: Self-pay | Admitting: Physician Assistant

## 2021-08-19 NOTE — Progress Notes (Signed)
   Follow-Up Visit   Subjective  Anthony Cline is a 75 y.o. male who presents for the following: Annual Exam (New lesion on left & right arm- scaly spot x few weeks, scalp- dark spots. Personal history of non mole skin cancer,but no melanoma. No family history of melanoma, just non mole skin cancers. ).   The following portions of the chart were reviewed this encounter and updated as appropriate:  Tobacco  Allergies  Meds  Problems  Med Hx  Surg Hx  Fam Hx      Objective  Well appearing patient in no apparent distress; mood and affect are within normal limits.  All skin waist up examined.  Head - Anterior (Face) (6), Left Scaphoid Fossa, Left Temple, Scalp Erythematous patches with gritty scale.  Mid Forehead Erythematous stuck-on, waxy papule or plaque.    Assessment & Plan  AK (actinic keratosis) (9) Head - Anterior (Face) (6); Left Scaphoid Fossa; Left Temple; Scalp  Destruction of lesion - Head - Anterior (Face), Left Scaphoid Fossa, Left Temple, Scalp Complexity: simple   Destruction method: cryotherapy   Informed consent: discussed and consent obtained   Timeout:  patient name, date of birth, surgical site, and procedure verified Lesion destroyed using liquid nitrogen: Yes   Cryotherapy cycles:  1 Outcome: patient tolerated procedure well with no complications   Post-procedure details: wound care instructions given    Inflamed seborrheic keratosis Mid Forehead  Destruction of lesion - Mid Forehead Complexity: simple   Destruction method: cryotherapy   Informed consent: discussed and consent obtained   Timeout:  patient name, date of birth, surgical site, and procedure verified Lesion destroyed using liquid nitrogen: Yes   Cryotherapy cycles:  1 Outcome: patient tolerated procedure well with no complications   Post-procedure details: wound care instructions given      I, Alexandr Oehler, PA-C, have reviewed all documentation's for this visit.  The  documentation on 08/19/21 for the exam, diagnosis, procedures and orders are all accurate and complete.

## 2021-08-27 DIAGNOSIS — Z23 Encounter for immunization: Secondary | ICD-10-CM | POA: Diagnosis not present

## 2021-09-02 DIAGNOSIS — H0288B Meibomian gland dysfunction left eye, upper and lower eyelids: Secondary | ICD-10-CM | POA: Diagnosis not present

## 2021-09-12 DIAGNOSIS — H43813 Vitreous degeneration, bilateral: Secondary | ICD-10-CM | POA: Diagnosis not present

## 2021-10-04 DIAGNOSIS — Z789 Other specified health status: Secondary | ICD-10-CM | POA: Diagnosis not present

## 2021-10-04 DIAGNOSIS — Z299 Encounter for prophylactic measures, unspecified: Secondary | ICD-10-CM | POA: Diagnosis not present

## 2021-10-04 DIAGNOSIS — E7800 Pure hypercholesterolemia, unspecified: Secondary | ICD-10-CM | POA: Diagnosis not present

## 2021-10-04 DIAGNOSIS — J069 Acute upper respiratory infection, unspecified: Secondary | ICD-10-CM | POA: Diagnosis not present

## 2021-10-08 DIAGNOSIS — B351 Tinea unguium: Secondary | ICD-10-CM | POA: Diagnosis not present

## 2021-10-08 DIAGNOSIS — M722 Plantar fascial fibromatosis: Secondary | ICD-10-CM | POA: Diagnosis not present

## 2021-10-08 DIAGNOSIS — M21961 Unspecified acquired deformity of right lower leg: Secondary | ICD-10-CM | POA: Diagnosis not present

## 2021-10-08 DIAGNOSIS — M79672 Pain in left foot: Secondary | ICD-10-CM | POA: Diagnosis not present

## 2021-10-08 DIAGNOSIS — M21962 Unspecified acquired deformity of left lower leg: Secondary | ICD-10-CM | POA: Diagnosis not present

## 2021-10-29 DIAGNOSIS — B351 Tinea unguium: Secondary | ICD-10-CM | POA: Diagnosis not present

## 2021-10-29 DIAGNOSIS — M21961 Unspecified acquired deformity of right lower leg: Secondary | ICD-10-CM | POA: Diagnosis not present

## 2021-10-29 DIAGNOSIS — M21962 Unspecified acquired deformity of left lower leg: Secondary | ICD-10-CM | POA: Diagnosis not present

## 2021-10-29 DIAGNOSIS — M722 Plantar fascial fibromatosis: Secondary | ICD-10-CM | POA: Diagnosis not present

## 2021-10-29 DIAGNOSIS — L603 Nail dystrophy: Secondary | ICD-10-CM | POA: Diagnosis not present

## 2021-10-29 DIAGNOSIS — R972 Elevated prostate specific antigen [PSA]: Secondary | ICD-10-CM | POA: Diagnosis not present

## 2021-10-29 DIAGNOSIS — M79672 Pain in left foot: Secondary | ICD-10-CM | POA: Diagnosis not present

## 2021-11-08 DIAGNOSIS — B351 Tinea unguium: Secondary | ICD-10-CM | POA: Diagnosis not present

## 2021-11-08 DIAGNOSIS — L603 Nail dystrophy: Secondary | ICD-10-CM | POA: Diagnosis not present

## 2021-11-11 DIAGNOSIS — R972 Elevated prostate specific antigen [PSA]: Secondary | ICD-10-CM | POA: Diagnosis not present

## 2021-11-11 DIAGNOSIS — N486 Induration penis plastica: Secondary | ICD-10-CM | POA: Diagnosis not present

## 2021-11-11 DIAGNOSIS — N400 Enlarged prostate without lower urinary tract symptoms: Secondary | ICD-10-CM | POA: Diagnosis not present

## 2021-11-11 DIAGNOSIS — N529 Male erectile dysfunction, unspecified: Secondary | ICD-10-CM | POA: Diagnosis not present

## 2021-11-28 DIAGNOSIS — M79672 Pain in left foot: Secondary | ICD-10-CM | POA: Diagnosis not present

## 2021-11-28 DIAGNOSIS — M722 Plantar fascial fibromatosis: Secondary | ICD-10-CM | POA: Diagnosis not present

## 2021-11-28 DIAGNOSIS — M21962 Unspecified acquired deformity of left lower leg: Secondary | ICD-10-CM | POA: Diagnosis not present

## 2021-11-28 DIAGNOSIS — M21961 Unspecified acquired deformity of right lower leg: Secondary | ICD-10-CM | POA: Diagnosis not present

## 2021-11-28 DIAGNOSIS — B351 Tinea unguium: Secondary | ICD-10-CM | POA: Diagnosis not present

## 2021-12-31 DIAGNOSIS — M722 Plantar fascial fibromatosis: Secondary | ICD-10-CM | POA: Diagnosis not present

## 2022-01-28 DIAGNOSIS — M722 Plantar fascial fibromatosis: Secondary | ICD-10-CM | POA: Diagnosis not present

## 2022-02-11 ENCOUNTER — Ambulatory Visit: Payer: Medicare Other | Admitting: Physician Assistant

## 2022-02-11 ENCOUNTER — Other Ambulatory Visit: Payer: Self-pay

## 2022-02-11 ENCOUNTER — Encounter: Payer: Self-pay | Admitting: Physician Assistant

## 2022-02-11 DIAGNOSIS — Z85828 Personal history of other malignant neoplasm of skin: Secondary | ICD-10-CM

## 2022-02-11 DIAGNOSIS — Z1283 Encounter for screening for malignant neoplasm of skin: Secondary | ICD-10-CM

## 2022-02-11 DIAGNOSIS — L570 Actinic keratosis: Secondary | ICD-10-CM

## 2022-02-11 NOTE — Progress Notes (Signed)
? ?  Follow-Up Visit ?  ?Subjective  ?Anthony Cline is a 76 y.o. male who presents for the following: Annual Exam (No new concerns- Not sure of family history of melanoma or non mole skin cancers. Personal history of non mole skin cancers but no melanoma. ). ? ? ?The following portions of the chart were reviewed this encounter and updated as appropriate:  Tobacco  Allergies  Meds  Problems  Med Hx  Surg Hx  Fam Hx   ?  ? ?Objective  ?Well appearing patient in no apparent distress; mood and affect are within normal limits. ? ?All skin waist up examined. ? ?Left Hand - Anterior, forhead, right bulb of nose, right hand ?Erythematous patches with gritty scale. ? ? ?Assessment & Plan  ?AK (actinic keratosis) (4) ?right hand; Left Hand - Anterior; right bulb of nose; forhead ? ?Destruction of lesion - Left Hand - Anterior, forhead, right bulb of nose, right hand ?Complexity: simple   ?Destruction method: cryotherapy   ?Informed consent: discussed and consent obtained   ?Timeout:  patient name, date of birth, surgical site, and procedure verified ?Lesion destroyed using liquid nitrogen: Yes   ?Cryotherapy cycles:  1 ?Outcome: patient tolerated procedure well with no complications   ?Post-procedure details: wound care instructions given   ? ? ?No atypical nevi noted at the time of the visit. ?I, Sanjay Broadfoot, PA-C, have reviewed all documentation's for this visit.  The documentation on 02/11/22 for the exam, diagnosis, procedures and orders are all accurate and complete. ?

## 2022-04-04 ENCOUNTER — Other Ambulatory Visit: Payer: Self-pay | Admitting: Endocrinology

## 2022-04-04 DIAGNOSIS — E049 Nontoxic goiter, unspecified: Secondary | ICD-10-CM

## 2022-04-29 DIAGNOSIS — M722 Plantar fascial fibromatosis: Secondary | ICD-10-CM | POA: Diagnosis not present

## 2022-04-29 DIAGNOSIS — M21961 Unspecified acquired deformity of right lower leg: Secondary | ICD-10-CM | POA: Diagnosis not present

## 2022-05-06 ENCOUNTER — Ambulatory Visit
Admission: RE | Admit: 2022-05-06 | Discharge: 2022-05-06 | Disposition: A | Discharge status: Home / Self Care | Payer: Medicare Other | Source: Ambulatory Visit | Attending: Endocrinology | Admitting: Endocrinology

## 2022-05-06 DIAGNOSIS — E049 Nontoxic goiter, unspecified: Secondary | ICD-10-CM

## 2022-05-06 DIAGNOSIS — E02 Subclinical iodine-deficiency hypothyroidism: Secondary | ICD-10-CM | POA: Diagnosis not present

## 2022-05-06 DIAGNOSIS — E041 Nontoxic single thyroid nodule: Secondary | ICD-10-CM | POA: Diagnosis not present

## 2022-05-15 DIAGNOSIS — E02 Subclinical iodine-deficiency hypothyroidism: Secondary | ICD-10-CM | POA: Diagnosis not present

## 2022-05-15 DIAGNOSIS — E049 Nontoxic goiter, unspecified: Secondary | ICD-10-CM | POA: Diagnosis not present

## 2022-05-27 DIAGNOSIS — Z299 Encounter for prophylactic measures, unspecified: Secondary | ICD-10-CM | POA: Diagnosis not present

## 2022-05-27 DIAGNOSIS — Z0000 Encounter for general adult medical examination without abnormal findings: Secondary | ICD-10-CM | POA: Diagnosis not present

## 2022-05-27 DIAGNOSIS — Z713 Dietary counseling and surveillance: Secondary | ICD-10-CM | POA: Diagnosis not present

## 2022-05-27 DIAGNOSIS — Z789 Other specified health status: Secondary | ICD-10-CM | POA: Diagnosis not present

## 2022-05-27 DIAGNOSIS — Z7189 Other specified counseling: Secondary | ICD-10-CM | POA: Diagnosis not present

## 2022-05-27 DIAGNOSIS — E7800 Pure hypercholesterolemia, unspecified: Secondary | ICD-10-CM | POA: Diagnosis not present

## 2022-05-29 DIAGNOSIS — M21961 Unspecified acquired deformity of right lower leg: Secondary | ICD-10-CM | POA: Diagnosis not present

## 2022-05-29 DIAGNOSIS — M722 Plantar fascial fibromatosis: Secondary | ICD-10-CM | POA: Diagnosis not present

## 2022-09-11 ENCOUNTER — Ambulatory Visit: Payer: Medicare Other | Admitting: Physician Assistant

## 2022-09-16 DIAGNOSIS — Z79899 Other long term (current) drug therapy: Secondary | ICD-10-CM | POA: Diagnosis not present

## 2022-09-16 DIAGNOSIS — Z299 Encounter for prophylactic measures, unspecified: Secondary | ICD-10-CM | POA: Diagnosis not present

## 2022-09-16 DIAGNOSIS — Z0000 Encounter for general adult medical examination without abnormal findings: Secondary | ICD-10-CM | POA: Diagnosis not present

## 2022-09-16 DIAGNOSIS — E7800 Pure hypercholesterolemia, unspecified: Secondary | ICD-10-CM | POA: Diagnosis not present

## 2022-09-16 DIAGNOSIS — R5383 Other fatigue: Secondary | ICD-10-CM | POA: Diagnosis not present

## 2022-11-04 DIAGNOSIS — H524 Presbyopia: Secondary | ICD-10-CM | POA: Diagnosis not present

## 2022-11-04 DIAGNOSIS — H43813 Vitreous degeneration, bilateral: Secondary | ICD-10-CM | POA: Diagnosis not present

## 2022-11-18 DIAGNOSIS — M722 Plantar fascial fibromatosis: Secondary | ICD-10-CM | POA: Diagnosis not present

## 2023-02-09 DIAGNOSIS — U071 COVID-19: Secondary | ICD-10-CM | POA: Diagnosis not present

## 2023-02-09 DIAGNOSIS — I6529 Occlusion and stenosis of unspecified carotid artery: Secondary | ICD-10-CM | POA: Diagnosis not present

## 2023-02-09 DIAGNOSIS — E039 Hypothyroidism, unspecified: Secondary | ICD-10-CM | POA: Diagnosis not present

## 2023-02-17 DIAGNOSIS — L570 Actinic keratosis: Secondary | ICD-10-CM | POA: Diagnosis not present

## 2023-02-17 DIAGNOSIS — Z1283 Encounter for screening for malignant neoplasm of skin: Secondary | ICD-10-CM | POA: Diagnosis not present

## 2023-02-17 DIAGNOSIS — L568 Other specified acute skin changes due to ultraviolet radiation: Secondary | ICD-10-CM | POA: Diagnosis not present

## 2023-02-17 DIAGNOSIS — L2089 Other atopic dermatitis: Secondary | ICD-10-CM | POA: Diagnosis not present

## 2023-03-18 IMAGING — US US THYROID
1 series · 13 of 25 positions shown · non-contrast
Comparison: March 2020, multiple priors

CLINICAL DATA: Prior ultrasound follow-up.

EXAM:
THYROID ULTRASOUND
TECHNIQUE: Ultrasound examination of the thyroid gland and adjacent soft
tissues was performed.

[Series 1: us thyroid · 0.06mm/px · 13 of 44 slices shown]
[im 1/44]
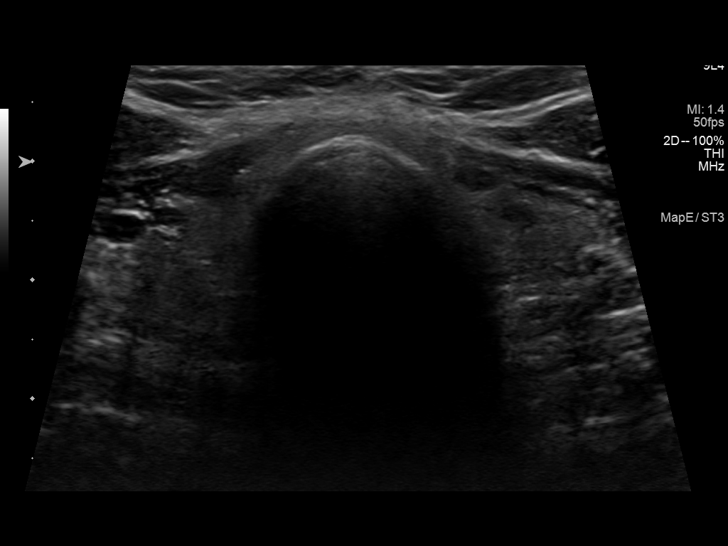
[im 4/44]
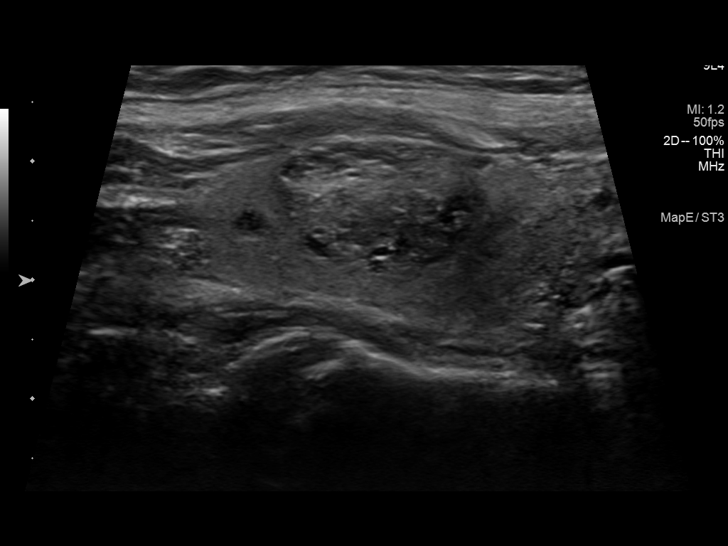
[im 8/44]
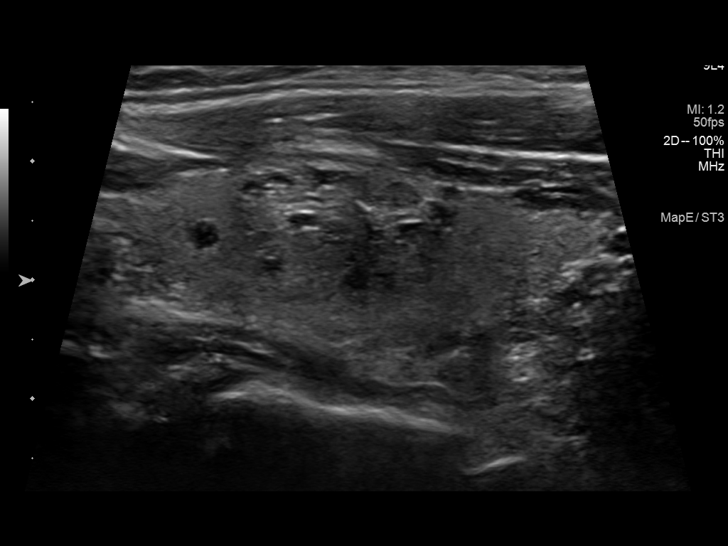
[im 11/44]
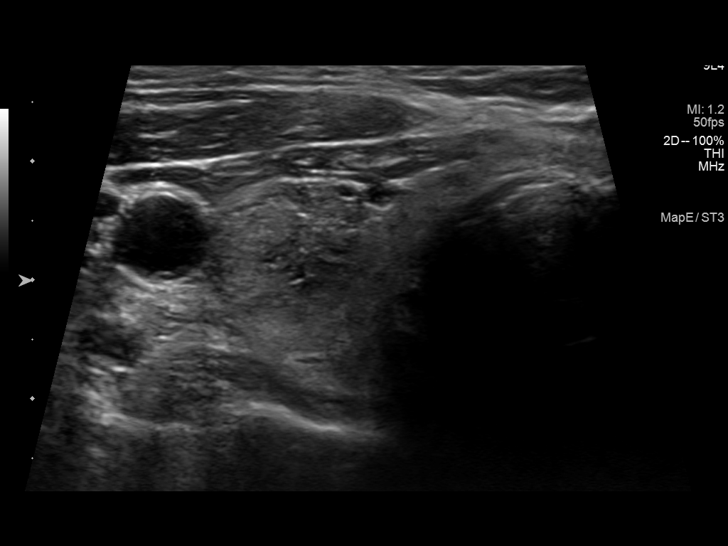
[im 15/44]
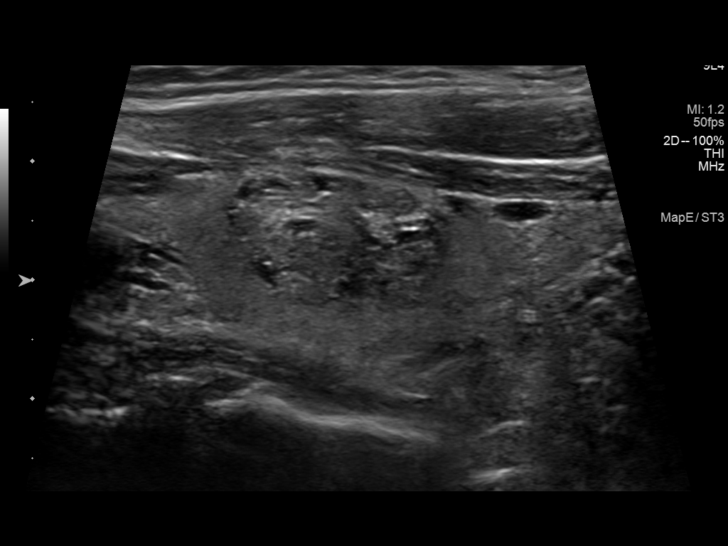
[im 18/44]
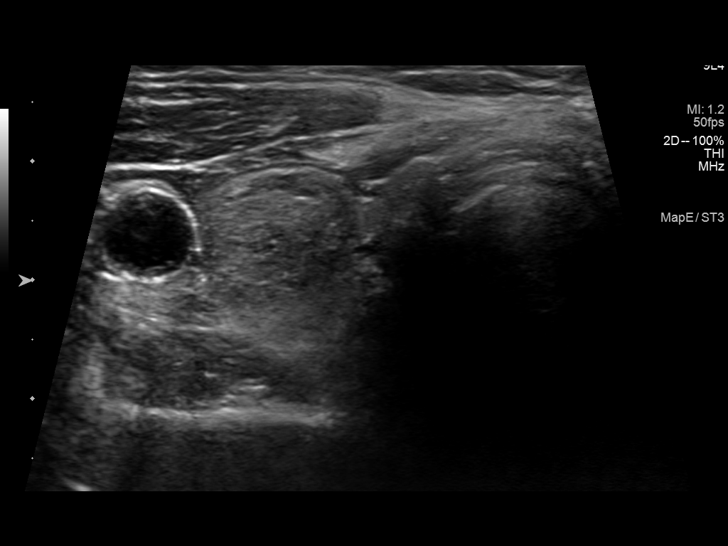
[im 22/44]
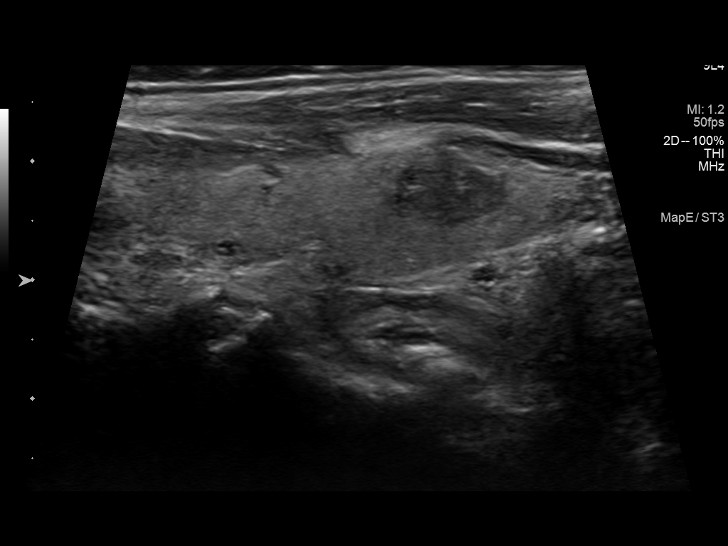
[im 26/44]
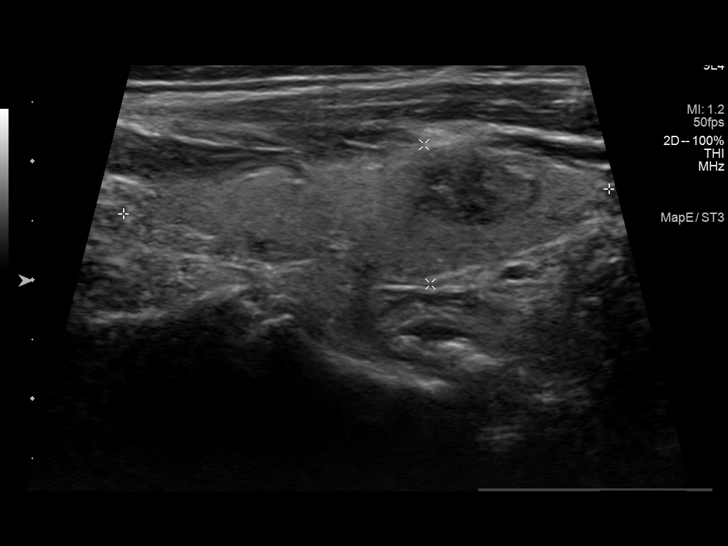
[im 29/44]
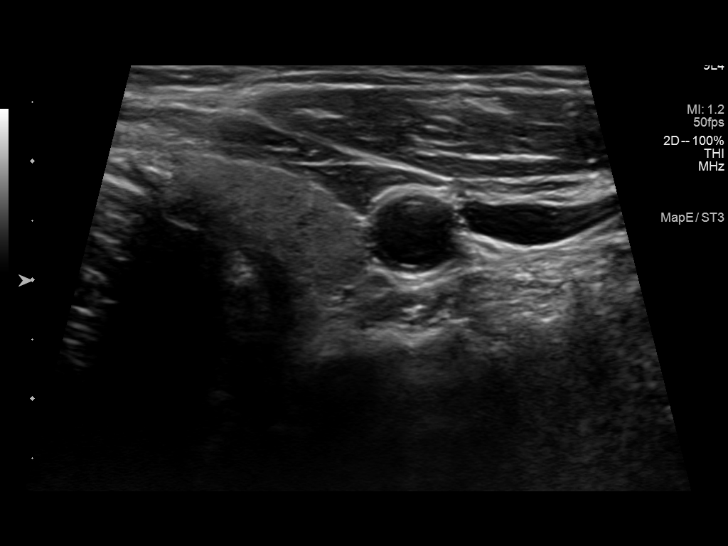
[im 33/44]
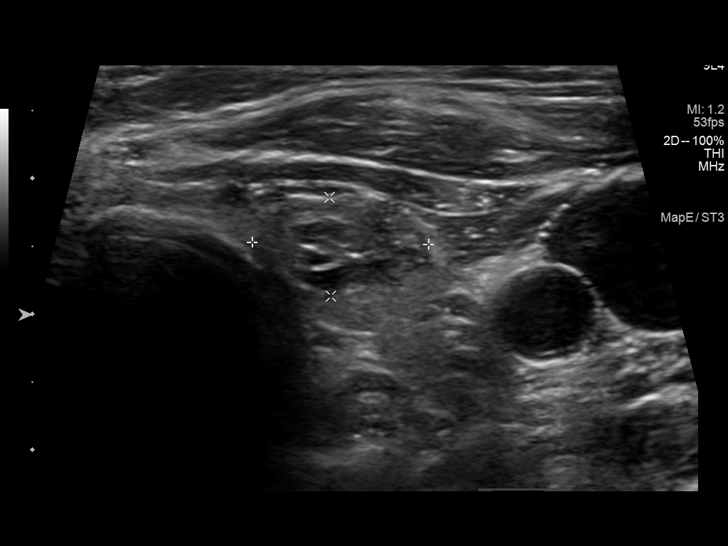
[im 36/44]
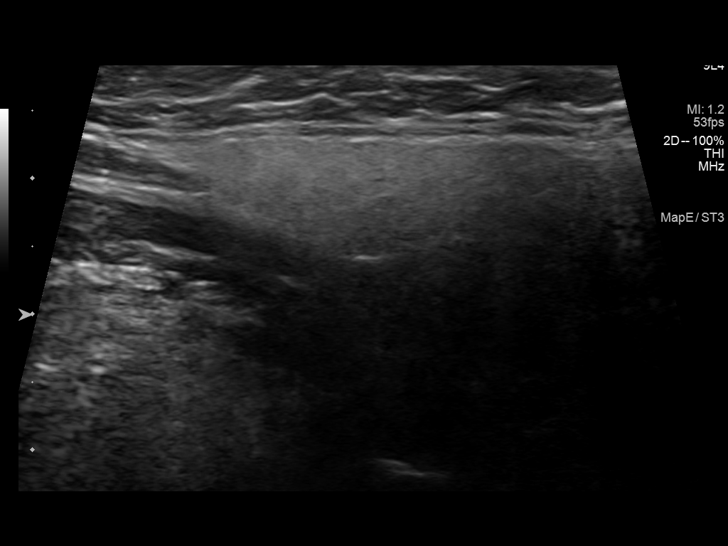
[im 40/44]
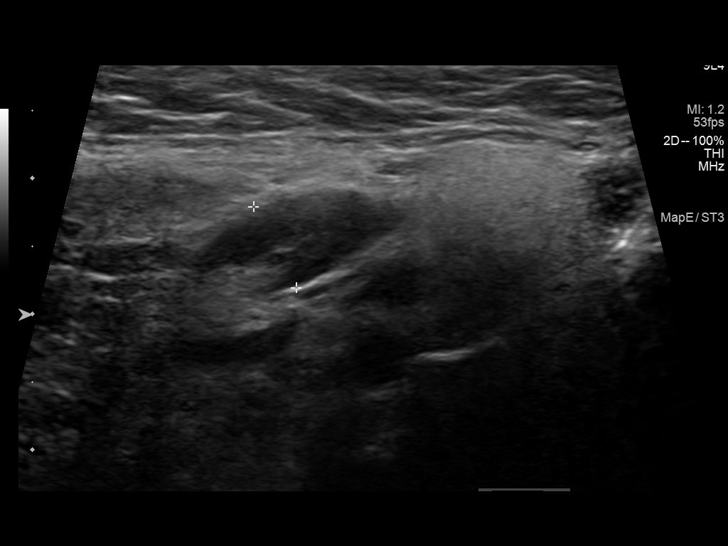
[im 44/44]
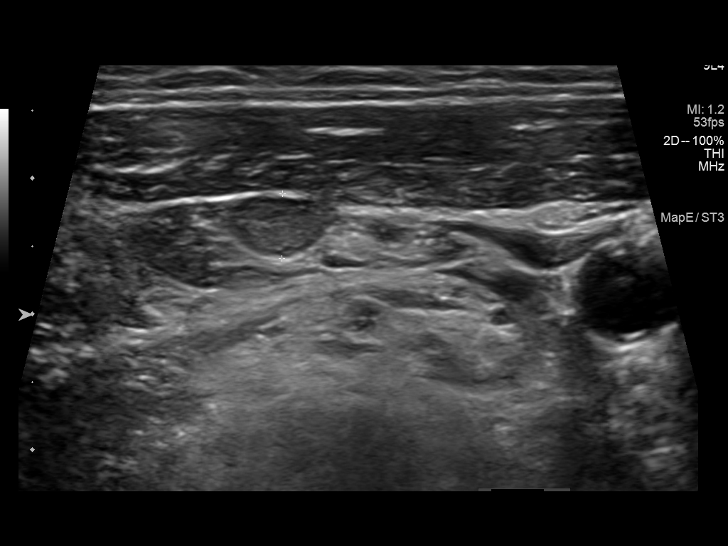

[13 of 25 positions shown; findings below may reference images not displayed]

FINDINGS: Parenchymal Echotexture: Mildly heterogenous

Isthmus: 0.2 cm

Right lobe: 4.0 x 1.6 x 1.7 cm

Left lobe: 4.1 x 1.2 x 1.5 cm

_________________________________________________________

Estimated total number of nodules >/= 1 cm: 2

Number of spongiform nodules >/=  2 cm not described below (TR1): 0

Number of mixed cystic and solid nodules >/= 1.5 cm not described
below (TR2): 0

_________________________________________________________

Nodule labeled 1 is a previously biopsied solid nodule in the
superior right thyroid lobe measuring 1.8 x 1.3 x 1.0 cm, which
remains similar in size and morphology previously measuring up to
1.7 cm. This nodule was previously biopsied.

Nodule labeled 2 is a previously biopsied solid nodule in the
inferior left thyroid lobe measuring 1.3 x 1.3 x 0.7 cm, which
remains similar in size and morphology previously measuring up to
1.4 cm. This nodule was previously biopsied.
IMPRESSION: Multinodular thyroid gland. No new or enlarging thyroid nodules.
Previously biopsied bilateral thyroid nodules remain similar in size
and morphology. Correlate with biopsy results.

## 2023-04-21 DIAGNOSIS — M722 Plantar fascial fibromatosis: Secondary | ICD-10-CM | POA: Diagnosis not present

## 2023-05-05 DIAGNOSIS — H40013 Open angle with borderline findings, low risk, bilateral: Secondary | ICD-10-CM | POA: Diagnosis not present

## 2023-05-08 DIAGNOSIS — Z299 Encounter for prophylactic measures, unspecified: Secondary | ICD-10-CM | POA: Diagnosis not present

## 2023-05-08 DIAGNOSIS — D0462 Carcinoma in situ of skin of left upper limb, including shoulder: Secondary | ICD-10-CM | POA: Diagnosis not present

## 2023-05-08 DIAGNOSIS — C44629 Squamous cell carcinoma of skin of left upper limb, including shoulder: Secondary | ICD-10-CM | POA: Diagnosis not present

## 2023-05-08 DIAGNOSIS — D485 Neoplasm of uncertain behavior of skin: Secondary | ICD-10-CM | POA: Diagnosis not present

## 2023-05-19 DIAGNOSIS — E02 Subclinical iodine-deficiency hypothyroidism: Secondary | ICD-10-CM | POA: Diagnosis not present

## 2023-05-19 DIAGNOSIS — E049 Nontoxic goiter, unspecified: Secondary | ICD-10-CM | POA: Diagnosis not present

## 2023-05-26 DIAGNOSIS — E049 Nontoxic goiter, unspecified: Secondary | ICD-10-CM | POA: Diagnosis not present

## 2023-05-26 DIAGNOSIS — E02 Subclinical iodine-deficiency hypothyroidism: Secondary | ICD-10-CM | POA: Diagnosis not present

## 2023-06-08 DIAGNOSIS — C4492 Squamous cell carcinoma of skin, unspecified: Secondary | ICD-10-CM | POA: Diagnosis not present

## 2023-06-08 DIAGNOSIS — Z299 Encounter for prophylactic measures, unspecified: Secondary | ICD-10-CM | POA: Diagnosis not present

## 2023-06-08 DIAGNOSIS — C44529 Squamous cell carcinoma of skin of other part of trunk: Secondary | ICD-10-CM | POA: Diagnosis not present

## 2023-06-11 DIAGNOSIS — R5383 Other fatigue: Secondary | ICD-10-CM | POA: Diagnosis not present

## 2023-06-11 DIAGNOSIS — Z299 Encounter for prophylactic measures, unspecified: Secondary | ICD-10-CM | POA: Diagnosis not present

## 2023-06-11 DIAGNOSIS — Z6822 Body mass index (BMI) 22.0-22.9, adult: Secondary | ICD-10-CM | POA: Diagnosis not present

## 2023-06-11 DIAGNOSIS — Z7189 Other specified counseling: Secondary | ICD-10-CM | POA: Diagnosis not present

## 2023-06-11 DIAGNOSIS — E7800 Pure hypercholesterolemia, unspecified: Secondary | ICD-10-CM | POA: Diagnosis not present

## 2023-06-11 DIAGNOSIS — Z0000 Encounter for general adult medical examination without abnormal findings: Secondary | ICD-10-CM | POA: Diagnosis not present

## 2023-06-11 DIAGNOSIS — Z79899 Other long term (current) drug therapy: Secondary | ICD-10-CM | POA: Diagnosis not present

## 2023-06-11 DIAGNOSIS — E039 Hypothyroidism, unspecified: Secondary | ICD-10-CM | POA: Diagnosis not present

## 2023-09-22 DIAGNOSIS — E039 Hypothyroidism, unspecified: Secondary | ICD-10-CM | POA: Diagnosis not present

## 2023-09-22 DIAGNOSIS — Z0000 Encounter for general adult medical examination without abnormal findings: Secondary | ICD-10-CM | POA: Diagnosis not present

## 2023-09-22 DIAGNOSIS — Z299 Encounter for prophylactic measures, unspecified: Secondary | ICD-10-CM | POA: Diagnosis not present

## 2023-11-05 DIAGNOSIS — H40013 Open angle with borderline findings, low risk, bilateral: Secondary | ICD-10-CM | POA: Diagnosis not present

## 2023-11-05 DIAGNOSIS — H524 Presbyopia: Secondary | ICD-10-CM | POA: Diagnosis not present

## 2024-01-14 DIAGNOSIS — R6889 Other general symptoms and signs: Secondary | ICD-10-CM | POA: Diagnosis not present

## 2024-04-11 DIAGNOSIS — H00012 Hordeolum externum right lower eyelid: Secondary | ICD-10-CM | POA: Diagnosis not present

## 2024-04-26 DIAGNOSIS — L814 Other melanin hyperpigmentation: Secondary | ICD-10-CM | POA: Diagnosis not present

## 2024-04-26 DIAGNOSIS — L578 Other skin changes due to chronic exposure to nonionizing radiation: Secondary | ICD-10-CM | POA: Diagnosis not present

## 2024-04-26 DIAGNOSIS — D1801 Hemangioma of skin and subcutaneous tissue: Secondary | ICD-10-CM | POA: Diagnosis not present

## 2024-04-26 DIAGNOSIS — L570 Actinic keratosis: Secondary | ICD-10-CM | POA: Diagnosis not present

## 2024-04-26 DIAGNOSIS — L821 Other seborrheic keratosis: Secondary | ICD-10-CM | POA: Diagnosis not present

## 2024-04-26 DIAGNOSIS — Z08 Encounter for follow-up examination after completed treatment for malignant neoplasm: Secondary | ICD-10-CM | POA: Diagnosis not present

## 2024-04-26 DIAGNOSIS — Z85828 Personal history of other malignant neoplasm of skin: Secondary | ICD-10-CM | POA: Diagnosis not present

## 2024-05-26 DIAGNOSIS — E02 Subclinical iodine-deficiency hypothyroidism: Secondary | ICD-10-CM | POA: Diagnosis not present

## 2024-05-26 DIAGNOSIS — E049 Nontoxic goiter, unspecified: Secondary | ICD-10-CM | POA: Diagnosis not present

## 2024-06-02 DIAGNOSIS — E02 Subclinical iodine-deficiency hypothyroidism: Secondary | ICD-10-CM | POA: Diagnosis not present

## 2024-06-02 DIAGNOSIS — E049 Nontoxic goiter, unspecified: Secondary | ICD-10-CM | POA: Diagnosis not present

## 2024-06-21 DIAGNOSIS — Z7189 Other specified counseling: Secondary | ICD-10-CM | POA: Diagnosis not present

## 2024-06-21 DIAGNOSIS — Z1389 Encounter for screening for other disorder: Secondary | ICD-10-CM | POA: Diagnosis not present

## 2024-06-21 DIAGNOSIS — Z79899 Other long term (current) drug therapy: Secondary | ICD-10-CM | POA: Diagnosis not present

## 2024-06-21 DIAGNOSIS — Z0000 Encounter for general adult medical examination without abnormal findings: Secondary | ICD-10-CM | POA: Diagnosis not present

## 2024-06-21 DIAGNOSIS — R5383 Other fatigue: Secondary | ICD-10-CM | POA: Diagnosis not present

## 2024-06-21 DIAGNOSIS — Z299 Encounter for prophylactic measures, unspecified: Secondary | ICD-10-CM | POA: Diagnosis not present

## 2024-06-21 DIAGNOSIS — E7800 Pure hypercholesterolemia, unspecified: Secondary | ICD-10-CM | POA: Diagnosis not present
